# Patient Record
Sex: Female | Born: 1935 | Race: White | Hispanic: No | Marital: Single | Smoking: Never smoker
Health system: Southern US, Community
[De-identification: ages and names within clinical notes are randomized; demographics above are authoritative.]

## PROBLEM LIST (undated history)

## (undated) DIAGNOSIS — D649 Anemia, unspecified: Secondary | ICD-10-CM

## (undated) DIAGNOSIS — I2510 Atherosclerotic heart disease of native coronary artery without angina pectoris: Secondary | ICD-10-CM

## (undated) DIAGNOSIS — I10 Essential (primary) hypertension: Secondary | ICD-10-CM

## (undated) DIAGNOSIS — F29 Unspecified psychosis not due to a substance or known physiological condition: Secondary | ICD-10-CM

## (undated) DIAGNOSIS — F32A Depression, unspecified: Secondary | ICD-10-CM

## (undated) DIAGNOSIS — R627 Adult failure to thrive: Secondary | ICD-10-CM

## (undated) DIAGNOSIS — G4700 Insomnia, unspecified: Secondary | ICD-10-CM

## (undated) DIAGNOSIS — G8929 Other chronic pain: Secondary | ICD-10-CM

## (undated) DIAGNOSIS — E039 Hypothyroidism, unspecified: Secondary | ICD-10-CM

## (undated) DIAGNOSIS — M1990 Unspecified osteoarthritis, unspecified site: Secondary | ICD-10-CM

## (undated) DIAGNOSIS — N189 Chronic kidney disease, unspecified: Secondary | ICD-10-CM

## (undated) DIAGNOSIS — K219 Gastro-esophageal reflux disease without esophagitis: Secondary | ICD-10-CM

## (undated) DIAGNOSIS — F0280 Dementia in other diseases classified elsewhere without behavioral disturbance: Secondary | ICD-10-CM

## (undated) DIAGNOSIS — F0390 Unspecified dementia without behavioral disturbance: Secondary | ICD-10-CM

## (undated) DIAGNOSIS — E785 Hyperlipidemia, unspecified: Secondary | ICD-10-CM

## (undated) DIAGNOSIS — F329 Major depressive disorder, single episode, unspecified: Secondary | ICD-10-CM

## (undated) DIAGNOSIS — I4892 Unspecified atrial flutter: Secondary | ICD-10-CM

## (undated) DIAGNOSIS — G309 Alzheimer's disease, unspecified: Secondary | ICD-10-CM

## (undated) HISTORY — DX: Gastro-esophageal reflux disease without esophagitis: K21.9

## (undated) HISTORY — DX: Depression, unspecified: F32.A

## (undated) HISTORY — DX: Major depressive disorder, single episode, unspecified: F32.9

## (undated) HISTORY — DX: Unspecified psychosis not due to a substance or known physiological condition: F29

## (undated) HISTORY — DX: Unspecified dementia, unspecified severity, without behavioral disturbance, psychotic disturbance, mood disturbance, and anxiety: F03.90

## (undated) HISTORY — DX: Other chronic pain: G89.29

## (undated) HISTORY — DX: Chronic kidney disease, unspecified: N18.9

## (undated) HISTORY — DX: Hyperlipidemia, unspecified: E78.5

## (undated) HISTORY — DX: Unspecified osteoarthritis, unspecified site: M19.90

## (undated) HISTORY — DX: Insomnia, unspecified: G47.00

## (undated) HISTORY — DX: Dementia in other diseases classified elsewhere without behavioral disturbance: F02.80

## (undated) HISTORY — DX: Adult failure to thrive: R62.7

## (undated) HISTORY — DX: Atherosclerotic heart disease of native coronary artery without angina pectoris: I25.10

## (undated) HISTORY — DX: Alzheimer's disease, unspecified: G30.9

## (undated) HISTORY — DX: Unspecified atrial flutter: I48.92

## (undated) HISTORY — DX: Essential (primary) hypertension: I10

## (undated) HISTORY — DX: Anemia, unspecified: D64.9

## (undated) HISTORY — DX: Hypothyroidism, unspecified: E03.9

---

## 2002-08-13 ENCOUNTER — Encounter: Payer: Self-pay | Admitting: *Deleted

## 2002-08-13 ENCOUNTER — Emergency Department (HOSPITAL_COMMUNITY): Admission: EM | Admit: 2002-08-13 | Discharge: 2002-08-13 | Payer: Self-pay | Admitting: Emergency Medicine

## 2003-05-11 ENCOUNTER — Emergency Department (HOSPITAL_COMMUNITY): Admission: EM | Admit: 2003-05-11 | Discharge: 2003-05-12 | Payer: Self-pay | Admitting: Emergency Medicine

## 2004-03-06 ENCOUNTER — Emergency Department (HOSPITAL_COMMUNITY): Admission: EM | Admit: 2004-03-06 | Discharge: 2004-03-06 | Payer: Self-pay | Admitting: Emergency Medicine

## 2004-09-16 ENCOUNTER — Emergency Department (HOSPITAL_COMMUNITY): Admission: EM | Admit: 2004-09-16 | Discharge: 2004-09-17 | Payer: Self-pay | Admitting: Emergency Medicine

## 2004-10-09 ENCOUNTER — Encounter: Admission: RE | Admit: 2004-10-09 | Discharge: 2004-10-09 | Payer: Self-pay | Admitting: Internal Medicine

## 2004-11-12 ENCOUNTER — Emergency Department (HOSPITAL_COMMUNITY): Admission: EM | Admit: 2004-11-12 | Discharge: 2004-11-12 | Payer: Self-pay | Admitting: Emergency Medicine

## 2004-11-19 ENCOUNTER — Encounter: Admission: RE | Admit: 2004-11-19 | Discharge: 2004-11-19 | Payer: Self-pay | Admitting: Internal Medicine

## 2005-03-26 ENCOUNTER — Emergency Department (HOSPITAL_COMMUNITY): Admission: EM | Admit: 2005-03-26 | Discharge: 2005-03-26 | Payer: Self-pay | Admitting: *Deleted

## 2005-04-18 ENCOUNTER — Ambulatory Visit (HOSPITAL_COMMUNITY): Admission: RE | Admit: 2005-04-18 | Discharge: 2005-04-18 | Payer: Self-pay | Admitting: Internal Medicine

## 2005-04-29 ENCOUNTER — Inpatient Hospital Stay (HOSPITAL_COMMUNITY): Admission: EM | Admit: 2005-04-29 | Discharge: 2005-05-02 | Payer: Self-pay | Admitting: Emergency Medicine

## 2005-07-08 ENCOUNTER — Emergency Department (HOSPITAL_COMMUNITY): Admission: EM | Admit: 2005-07-08 | Discharge: 2005-07-09 | Payer: Self-pay | Admitting: Emergency Medicine

## 2005-07-10 ENCOUNTER — Emergency Department (HOSPITAL_COMMUNITY): Admission: EM | Admit: 2005-07-10 | Discharge: 2005-07-10 | Payer: Self-pay | Admitting: Emergency Medicine

## 2005-09-02 ENCOUNTER — Encounter: Admission: RE | Admit: 2005-09-02 | Discharge: 2005-09-02 | Payer: Self-pay | Admitting: Internal Medicine

## 2005-10-23 ENCOUNTER — Emergency Department (HOSPITAL_COMMUNITY): Admission: EM | Admit: 2005-10-23 | Discharge: 2005-10-23 | Payer: Self-pay | Admitting: Emergency Medicine

## 2005-11-13 ENCOUNTER — Encounter: Admission: RE | Admit: 2005-11-13 | Discharge: 2005-11-13 | Payer: Self-pay | Admitting: Internal Medicine

## 2005-12-26 ENCOUNTER — Encounter: Admission: RE | Admit: 2005-12-26 | Discharge: 2005-12-26 | Payer: Self-pay | Admitting: Internal Medicine

## 2006-03-13 ENCOUNTER — Emergency Department (HOSPITAL_COMMUNITY): Admission: EM | Admit: 2006-03-13 | Discharge: 2006-03-14 | Payer: Self-pay | Admitting: Emergency Medicine

## 2006-03-24 ENCOUNTER — Emergency Department (HOSPITAL_COMMUNITY): Admission: EM | Admit: 2006-03-24 | Discharge: 2006-03-24 | Payer: Self-pay | Admitting: Emergency Medicine

## 2006-04-01 ENCOUNTER — Emergency Department (HOSPITAL_COMMUNITY): Admission: EM | Admit: 2006-04-01 | Discharge: 2006-04-01 | Payer: Self-pay | Admitting: Emergency Medicine

## 2006-04-06 ENCOUNTER — Emergency Department (HOSPITAL_COMMUNITY): Admission: EM | Admit: 2006-04-06 | Discharge: 2006-04-06 | Payer: Self-pay | Admitting: Emergency Medicine

## 2006-05-14 ENCOUNTER — Emergency Department (HOSPITAL_COMMUNITY): Admission: EM | Admit: 2006-05-14 | Discharge: 2006-05-14 | Payer: Self-pay | Admitting: Emergency Medicine

## 2006-05-18 ENCOUNTER — Emergency Department (HOSPITAL_COMMUNITY): Admission: EM | Admit: 2006-05-18 | Discharge: 2006-05-18 | Payer: Self-pay | Admitting: Emergency Medicine

## 2007-01-23 ENCOUNTER — Emergency Department (HOSPITAL_COMMUNITY): Admission: EM | Admit: 2007-01-23 | Discharge: 2007-01-24 | Payer: Self-pay | Admitting: Emergency Medicine

## 2007-02-01 ENCOUNTER — Emergency Department (HOSPITAL_COMMUNITY): Admission: EM | Admit: 2007-02-01 | Discharge: 2007-02-02 | Payer: Self-pay | Admitting: Emergency Medicine

## 2007-02-09 ENCOUNTER — Emergency Department (HOSPITAL_COMMUNITY): Admission: EM | Admit: 2007-02-09 | Discharge: 2007-02-09 | Payer: Self-pay | Admitting: Emergency Medicine

## 2007-03-03 ENCOUNTER — Encounter: Admission: RE | Admit: 2007-03-03 | Discharge: 2007-03-03 | Payer: Self-pay | Admitting: Gastroenterology

## 2007-03-10 ENCOUNTER — Encounter: Admission: RE | Admit: 2007-03-10 | Discharge: 2007-03-10 | Payer: Self-pay | Admitting: Gastroenterology

## 2008-01-15 IMAGING — CT CT CERVICAL SPINE W/O CM
2 of 3 series · 9 of 14 positions shown, 11 images · IV contrast (agent unspecified)
Comparison: 04/28/2005.

CLINICAL DATA: Fall with confusion.  Headache and neck pain.  
 HEAD CT WITHOUT CONTRAST:
TECHNIQUE: Contiguous axial images were obtained from the base of the skull through the vertex according to standard protocol without contrast.
TECHNIQUE: Multidetector CT imaging of the cervical spine was performed.  Multiplanar CT image reconstructions were also generated.

[Series 2: head_seq 4.5 h45s st · axial · 0.43mm/px · z∈[-119,-74]mm · 2 of 32 slices shown]
[im 11/32  bone]
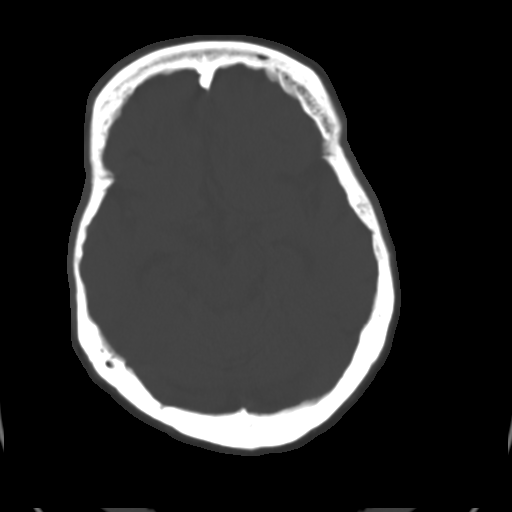
[im 21/32  bone]
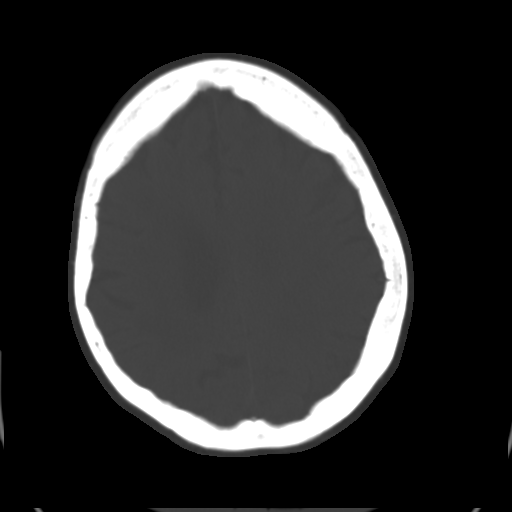

[Series 6: c_spine 2.0 b20s · axial · 0.23mm/px · z∈[-286,-166]mm · 7 of 81 slices shown, 9 images]
[im 11/81  soft-tissue]
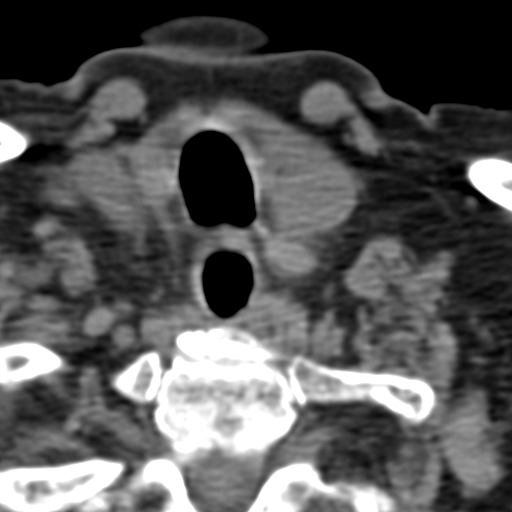
[im 11/81  bone]
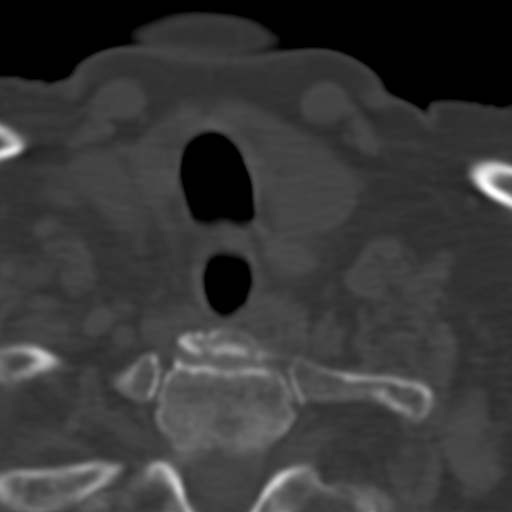
[im 21/81  bone]
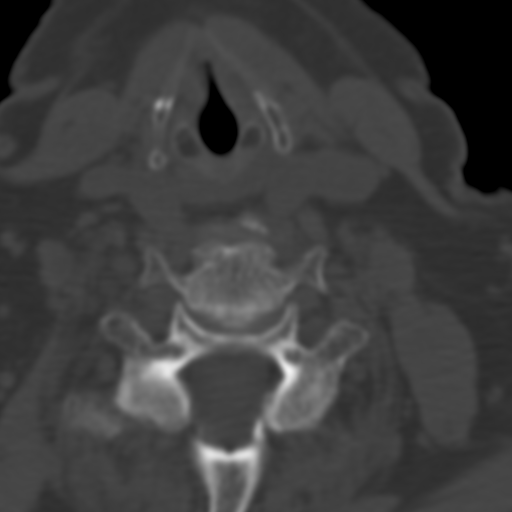
[im 31/81  bone]
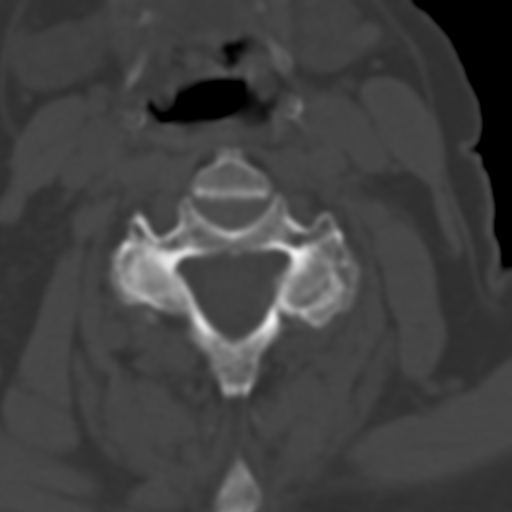
[im 41/81  bone]
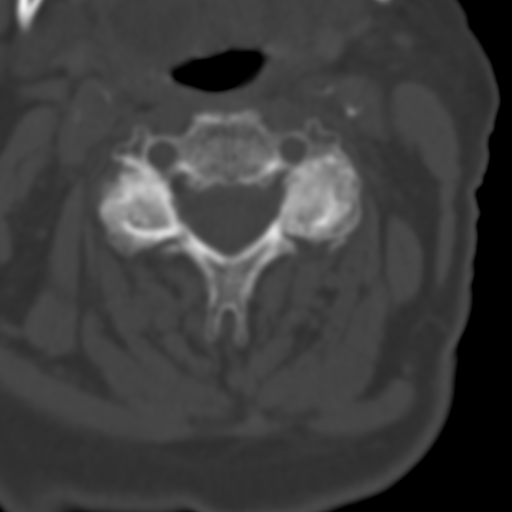
[im 51/81  soft-tissue]
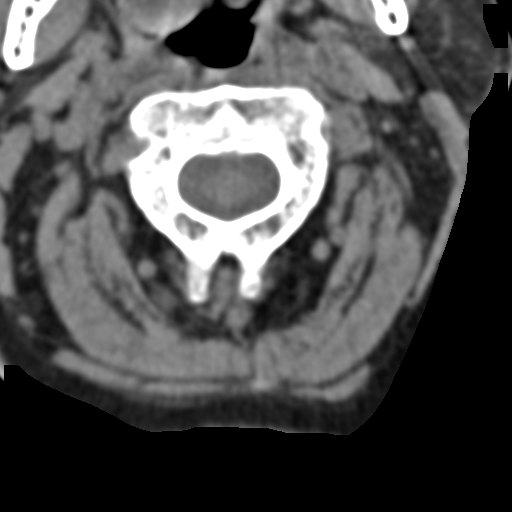
[im 51/81  bone]
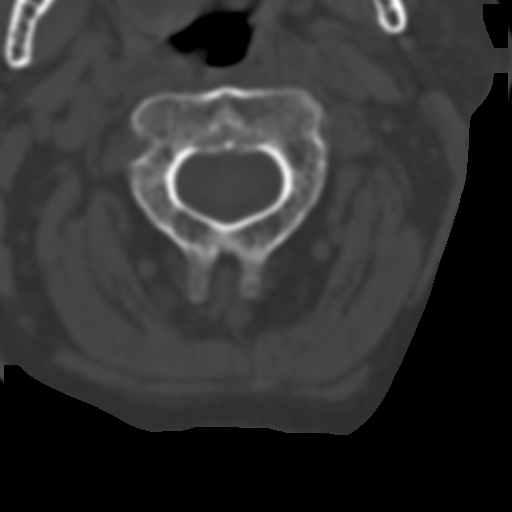
[im 61/81  bone]
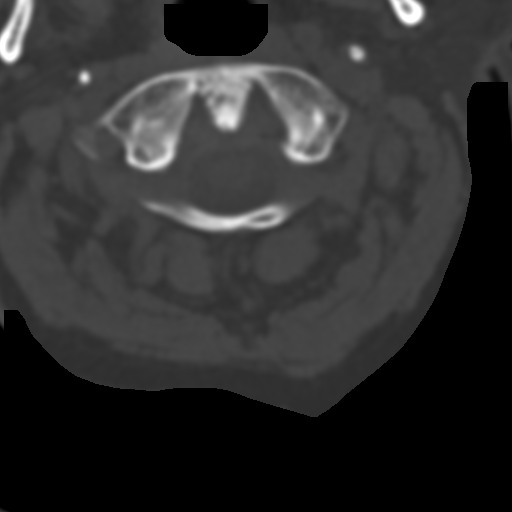
[im 71/81  bone]
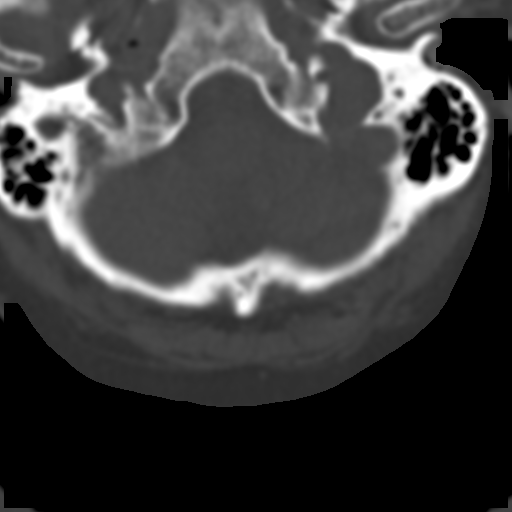

[9 of 14 positions shown; findings below may reference images not displayed]

FINDINGS: There is a stable appearance to diffuse advanced atrophy and small vessel disease.  No acute intracranial hemorrhage, mass effect or extraaxial fluid collection is identified.  No evidence of skull fracture.
IMPRESSION: Stable atrophy and small vessel disease.  No acute findings. 
 CERVICAL SPINE CT WITHOUT CONTRAST:
FINDINGS: No evidence of cervical spine fracture.  Diffuse spondylosis is present. No soft tissue abnormalities.
IMPRESSION: No acute injury to the cervical spine.

## 2008-01-15 IMAGING — CR DG SHOULDER 2+V*L*
3 series · 3 of 3 positions shown · non-contrast
Comparison: 04/28/2005.

CLINICAL DATA: Fall with injury.  
 LEFT SHOULDER ? 3 VIEW:

[view not recorded (1 of 3)]
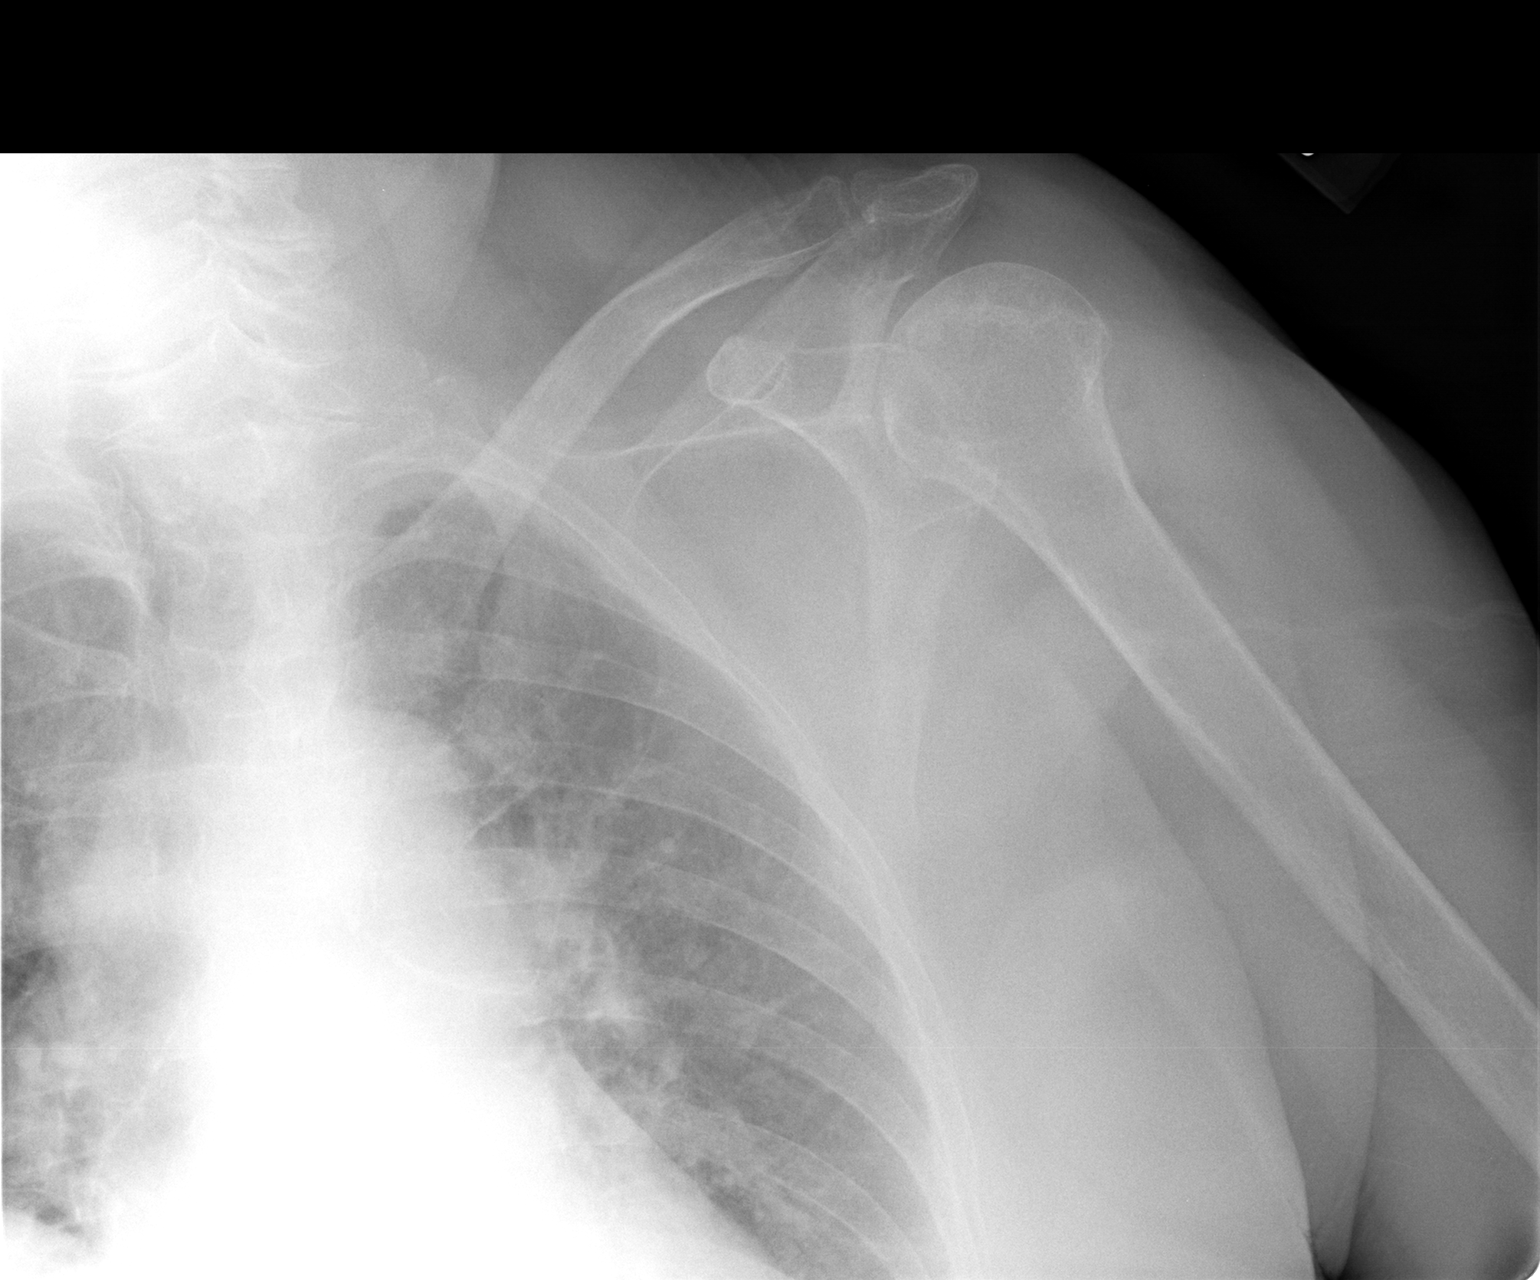

[view not recorded (2 of 3)]
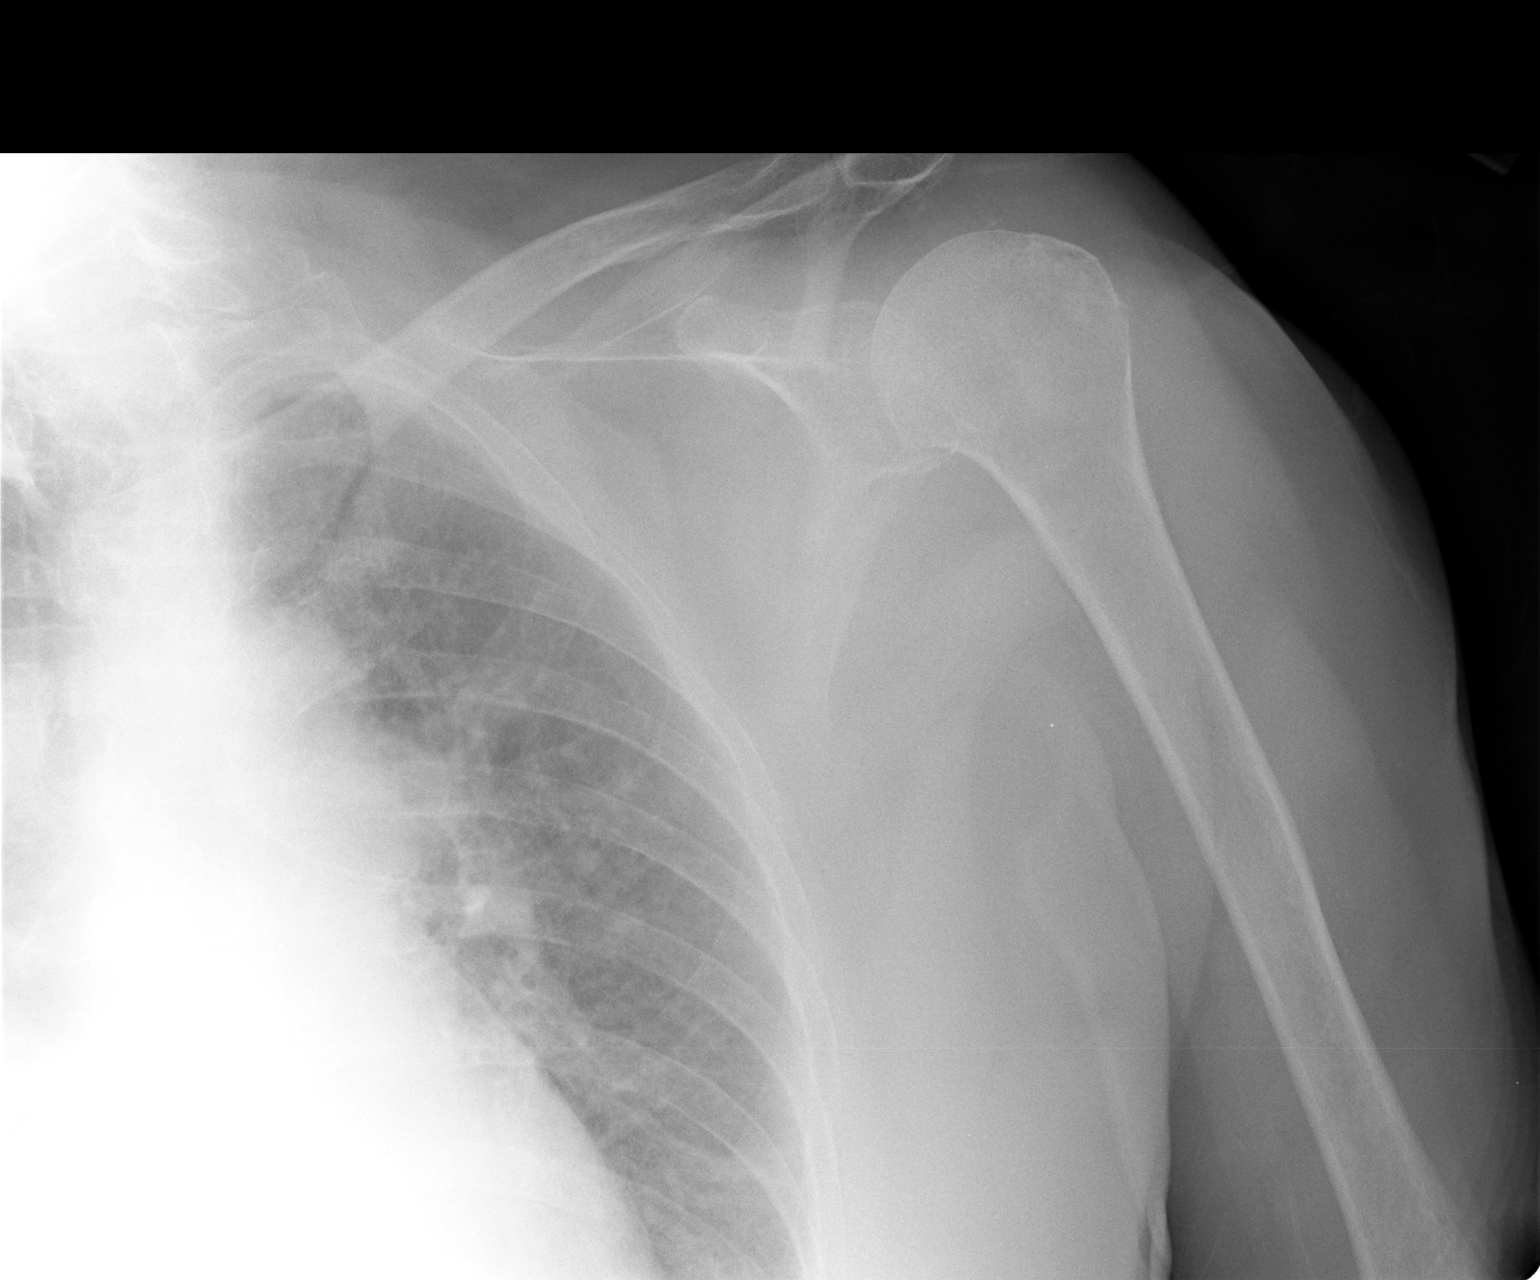

[view not recorded (3 of 3)]
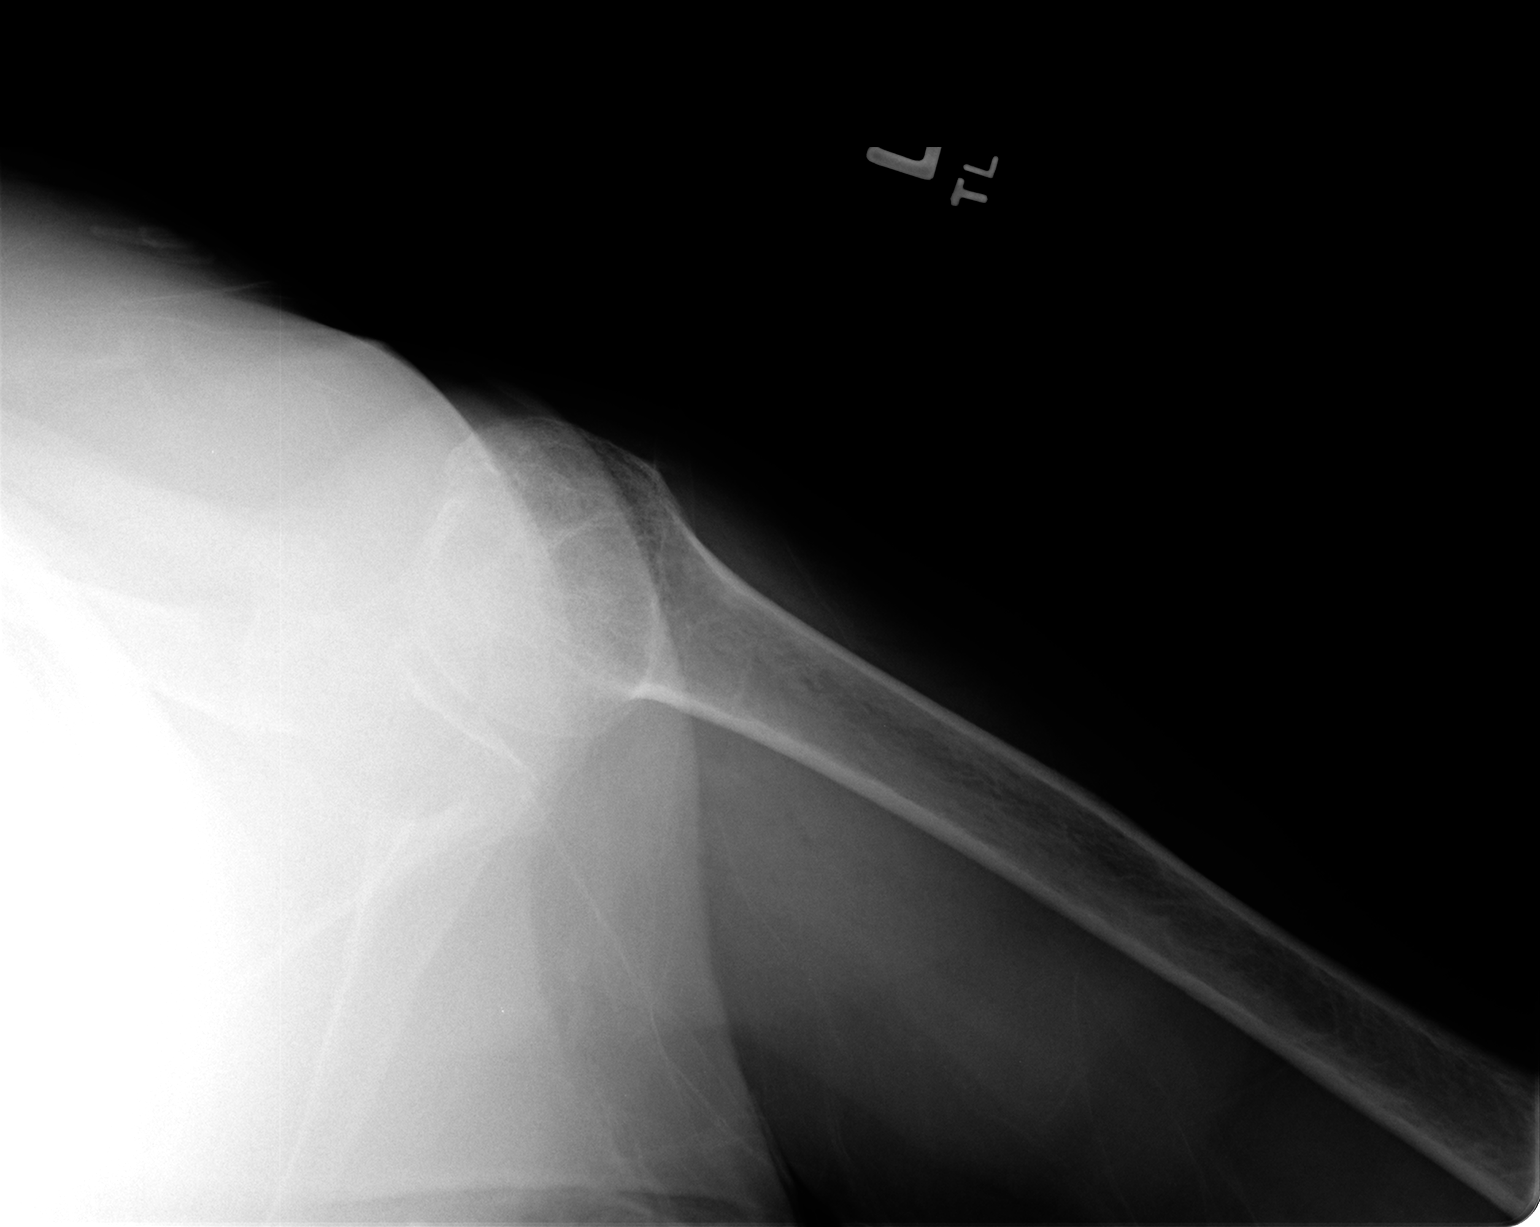

[3 of 3 positions shown; findings below may reference images not displayed]

FINDINGS: No acute fracture or dislocation.  Degenerative changes are again noted at the AC joint.
IMPRESSION: No acute findings.

## 2008-03-16 ENCOUNTER — Emergency Department (HOSPITAL_COMMUNITY): Admission: EM | Admit: 2008-03-16 | Discharge: 2008-03-16 | Payer: Self-pay | Admitting: Emergency Medicine

## 2008-09-04 ENCOUNTER — Emergency Department (HOSPITAL_COMMUNITY): Admission: EM | Admit: 2008-09-04 | Discharge: 2008-09-04 | Payer: Self-pay | Admitting: Emergency Medicine

## 2008-10-14 ENCOUNTER — Inpatient Hospital Stay (HOSPITAL_COMMUNITY): Admission: EM | Admit: 2008-10-14 | Discharge: 2008-10-18 | Payer: Self-pay | Admitting: Emergency Medicine

## 2009-01-29 ENCOUNTER — Emergency Department (HOSPITAL_COMMUNITY): Admission: EM | Admit: 2009-01-29 | Discharge: 2009-01-30 | Payer: Self-pay | Admitting: Emergency Medicine

## 2009-03-23 ENCOUNTER — Emergency Department (HOSPITAL_COMMUNITY): Admission: EM | Admit: 2009-03-23 | Discharge: 2009-03-24 | Payer: Self-pay | Admitting: Emergency Medicine

## 2010-04-14 ENCOUNTER — Encounter: Payer: Self-pay | Admitting: Internal Medicine

## 2010-05-18 ENCOUNTER — Emergency Department (HOSPITAL_COMMUNITY)
Admission: EM | Admit: 2010-05-18 | Discharge: 2010-05-18 | Disposition: A | Discharge status: Home / Self Care | Payer: Medicare Other | Attending: Emergency Medicine | Admitting: Emergency Medicine

## 2010-05-18 ENCOUNTER — Emergency Department (HOSPITAL_COMMUNITY): Payer: Medicare Other

## 2010-05-18 DIAGNOSIS — N390 Urinary tract infection, site not specified: Secondary | ICD-10-CM | POA: Insufficient documentation

## 2010-05-18 DIAGNOSIS — E039 Hypothyroidism, unspecified: Secondary | ICD-10-CM | POA: Insufficient documentation

## 2010-05-18 DIAGNOSIS — Z66 Do not resuscitate: Secondary | ICD-10-CM | POA: Insufficient documentation

## 2010-05-18 DIAGNOSIS — F0390 Unspecified dementia without behavioral disturbance: Secondary | ICD-10-CM | POA: Insufficient documentation

## 2010-05-18 DIAGNOSIS — Y9210 Unspecified residential institution as the place of occurrence of the external cause: Secondary | ICD-10-CM | POA: Insufficient documentation

## 2010-05-18 DIAGNOSIS — I6789 Other cerebrovascular disease: Secondary | ICD-10-CM | POA: Insufficient documentation

## 2010-05-18 DIAGNOSIS — I2510 Atherosclerotic heart disease of native coronary artery without angina pectoris: Secondary | ICD-10-CM | POA: Insufficient documentation

## 2010-05-18 DIAGNOSIS — I10 Essential (primary) hypertension: Secondary | ICD-10-CM | POA: Insufficient documentation

## 2010-05-18 DIAGNOSIS — S0003XA Contusion of scalp, initial encounter: Secondary | ICD-10-CM | POA: Insufficient documentation

## 2010-05-18 DIAGNOSIS — W06XXXA Fall from bed, initial encounter: Secondary | ICD-10-CM | POA: Insufficient documentation

## 2010-05-18 DIAGNOSIS — S1093XA Contusion of unspecified part of neck, initial encounter: Secondary | ICD-10-CM | POA: Insufficient documentation

## 2010-05-18 LAB — URINE MICROSCOPIC-ADD ON

## 2010-05-18 LAB — URINALYSIS, ROUTINE W REFLEX MICROSCOPIC
Nitrite: NEGATIVE
Specific Gravity, Urine: 1.026 (ref 1.005–1.030)
Urine Glucose, Fasting: NEGATIVE mg/dL
pH: 5 (ref 5.0–8.0)

## 2010-05-20 LAB — URINE CULTURE: Culture  Setup Time: 201202252105

## 2010-06-24 LAB — URINE CULTURE: Colony Count: >=100000

## 2010-06-24 LAB — COMPREHENSIVE METABOLIC PANEL
ALT: 11 U/L (ref 0–35)
Albumin: 3.1 g/dL — ABNORMAL LOW (ref 3.5–5.2)
Alkaline Phosphatase: 89 U/L (ref 39–117)
Chloride: 107 mEq/L (ref 96–112)
Potassium: 4.8 mEq/L (ref 3.5–5.1)
Sodium: 139 mEq/L (ref 135–145)
Total Bilirubin: 0.3 mg/dL (ref 0.3–1.2)
Total Protein: 6.6 g/dL (ref 6.0–8.3)

## 2010-06-24 LAB — URINALYSIS, ROUTINE W REFLEX MICROSCOPIC
Bilirubin Urine: NEGATIVE
Glucose, UA: NEGATIVE mg/dL
Ketones, ur: NEGATIVE mg/dL
Protein, ur: NEGATIVE mg/dL

## 2010-06-24 LAB — DIFFERENTIAL
Basophils Absolute: 0 10*3/uL (ref 0.0–0.1)
Basophils Relative: 1 % (ref 0–1)
Eosinophils Absolute: 0.5 10*3/uL (ref 0.0–0.7)
Eosinophils Relative: 8 % — ABNORMAL HIGH (ref 0–5)
Monocytes Absolute: 0.5 10*3/uL (ref 0.1–1.0)
Monocytes Relative: 8 % (ref 3–12)

## 2010-06-24 LAB — CBC
Hemoglobin: 12.5 g/dL (ref 12.0–15.0)
Platelets: 273 10*3/uL (ref 150–400)
RDW: 13.7 % (ref 11.5–15.5)
WBC: 6.6 10*3/uL (ref 4.0–10.5)

## 2010-06-24 LAB — URINE MICROSCOPIC-ADD ON

## 2010-06-26 LAB — URINE MICROSCOPIC-ADD ON

## 2010-06-26 LAB — CBC
HCT: 38 % (ref 36.0–46.0)
MCHC: 33.5 g/dL (ref 30.0–36.0)
Platelets: 316 10*3/uL (ref 150–400)
RDW: 13.5 % (ref 11.5–15.5)

## 2010-06-26 LAB — COMPREHENSIVE METABOLIC PANEL
Albumin: 3 g/dL — ABNORMAL LOW (ref 3.5–5.2)
Alkaline Phosphatase: 123 U/L — ABNORMAL HIGH (ref 39–117)
BUN: 32 mg/dL — ABNORMAL HIGH (ref 6–23)
Calcium: 9.3 mg/dL (ref 8.4–10.5)
Creatinine, Ser: 1.6 mg/dL — ABNORMAL HIGH (ref 0.4–1.2)
Glucose, Bld: 130 mg/dL — ABNORMAL HIGH (ref 70–99)
Potassium: 4.9 mEq/L (ref 3.5–5.1)
Total Protein: 6.9 g/dL (ref 6.0–8.3)

## 2010-06-26 LAB — DIFFERENTIAL
Lymphocytes Relative: 18 % (ref 12–46)
Lymphs Abs: 1.1 10*3/uL (ref 0.7–4.0)
Monocytes Absolute: 0.6 10*3/uL (ref 0.1–1.0)
Monocytes Relative: 9 % (ref 3–12)
Neutro Abs: 4.2 10*3/uL (ref 1.7–7.7)
Neutrophils Relative %: 67 % (ref 43–77)

## 2010-06-26 LAB — URINALYSIS, ROUTINE W REFLEX MICROSCOPIC
Glucose, UA: NEGATIVE mg/dL
Specific Gravity, Urine: 1.019 (ref 1.005–1.030)

## 2010-06-30 LAB — BASIC METABOLIC PANEL
BUN: 27 mg/dL — ABNORMAL HIGH (ref 6–23)
CO2: 24 mEq/L (ref 19–32)
CO2: 24 mEq/L (ref 19–32)
CO2: 25 mEq/L (ref 19–32)
Calcium: 7.9 mg/dL — ABNORMAL LOW (ref 8.4–10.5)
Calcium: 8.1 mg/dL — ABNORMAL LOW (ref 8.4–10.5)
Calcium: 9.1 mg/dL (ref 8.4–10.5)
Chloride: 107 mEq/L (ref 96–112)
Creatinine, Ser: 1.55 mg/dL — ABNORMAL HIGH (ref 0.4–1.2)
Creatinine, Ser: 1.66 mg/dL — ABNORMAL HIGH (ref 0.4–1.2)
GFR calc Af Amer: 34 mL/min — ABNORMAL LOW (ref >60)
GFR calc Af Amer: 35 mL/min — ABNORMAL LOW (ref >60)
GFR calc Af Amer: 38 mL/min — ABNORMAL LOW (ref >60)
GFR calc non Af Amer: 29 mL/min — ABNORMAL LOW (ref >60)
GFR calc non Af Amer: 31 mL/min — ABNORMAL LOW (ref >60)
Potassium: 4.6 mEq/L (ref 3.5–5.1)
Sodium: 138 mEq/L (ref 135–145)

## 2010-06-30 LAB — URINALYSIS, ROUTINE W REFLEX MICROSCOPIC
Bilirubin Urine: NEGATIVE
Ketones, ur: NEGATIVE mg/dL
Nitrite: POSITIVE — AB
Specific Gravity, Urine: 1.018 (ref 1.005–1.030)
Urobilinogen, UA: 0.2 mg/dL (ref 0.0–1.0)
pH: 5.5 (ref 5.0–8.0)

## 2010-06-30 LAB — CARDIAC PANEL(CRET KIN+CKTOT+MB+TROPI)
CK, MB: 1 ng/mL (ref 0.3–4.0)
Relative Index: INVALID (ref 0.0–2.5)
Total CK: 50 U/L (ref 7–177)
Troponin I: 0.03 ng/mL (ref 0.00–0.06)

## 2010-06-30 LAB — URINE MICROSCOPIC-ADD ON

## 2010-06-30 LAB — CBC
Hemoglobin: 11.1 g/dL — ABNORMAL LOW (ref 12.0–15.0)
MCHC: 33.1 g/dL (ref 30.0–36.0)
MCHC: 33.3 g/dL (ref 30.0–36.0)
MCV: 99.1 fL (ref 78.0–100.0)
Platelets: 257 10*3/uL (ref 150–400)
RBC: 3.31 MIL/uL — ABNORMAL LOW (ref 3.87–5.11)
RBC: 3.51 MIL/uL — ABNORMAL LOW (ref 3.87–5.11)
RDW: 13.3 % (ref 11.5–15.5)
RDW: 13.4 % (ref 11.5–15.5)

## 2010-06-30 LAB — URINE CULTURE: Colony Count: 1000

## 2010-06-30 LAB — DIFFERENTIAL
Basophils Absolute: 0 10*3/uL (ref 0.0–0.1)
Basophils Relative: 0 % (ref 0–1)
Eosinophils Absolute: 0.3 10*3/uL (ref 0.0–0.7)
Neutro Abs: 6.3 10*3/uL (ref 1.7–7.7)
Neutrophils Relative %: 75 % (ref 43–77)

## 2010-06-30 LAB — CK TOTAL AND CKMB (NOT AT ARMC)
CK, MB: 0.9 ng/mL (ref 0.3–4.0)
Relative Index: INVALID (ref 0.0–2.5)
Total CK: 50 U/L (ref 7–177)

## 2010-06-30 LAB — APTT: aPTT: 33 seconds (ref 24–37)

## 2010-07-01 LAB — COMPREHENSIVE METABOLIC PANEL
BUN: 28 mg/dL — ABNORMAL HIGH (ref 6–23)
CO2: 23 mEq/L (ref 19–32)
Chloride: 111 mEq/L (ref 96–112)
Creatinine, Ser: 1.83 mg/dL — ABNORMAL HIGH (ref 0.4–1.2)
GFR calc non Af Amer: 26 mL/min — ABNORMAL LOW (ref >60)
Glucose, Bld: 106 mg/dL — ABNORMAL HIGH (ref 70–99)
Total Bilirubin: 0.4 mg/dL (ref 0.3–1.2)

## 2010-07-01 LAB — URINALYSIS, ROUTINE W REFLEX MICROSCOPIC
Bilirubin Urine: NEGATIVE
Ketones, ur: NEGATIVE mg/dL
Nitrite: NEGATIVE
Protein, ur: NEGATIVE mg/dL
Urobilinogen, UA: 0.2 mg/dL (ref 0.0–1.0)

## 2010-07-01 LAB — CBC
HCT: 37.7 % (ref 36.0–46.0)
MCV: 99.5 fL (ref 78.0–100.0)
RBC: 3.79 MIL/uL — ABNORMAL LOW (ref 3.87–5.11)
WBC: 13 10*3/uL — ABNORMAL HIGH (ref 4.0–10.5)

## 2010-07-01 LAB — DIFFERENTIAL
Basophils Absolute: 0 10*3/uL (ref 0.0–0.1)
Eosinophils Relative: 1 % (ref 0–5)
Lymphocytes Relative: 6 % — ABNORMAL LOW (ref 12–46)
Neutro Abs: 11.3 10*3/uL — ABNORMAL HIGH (ref 1.7–7.7)

## 2010-08-06 NOTE — H&P (Signed)
Debbie [name], Debbie [name]               ACCOUNT NO.:  [id number]   MEDICAL RECORD NO.:  [id number]          PATIENT TYPE:  EMS   LOCATION:  ED                           FACILITY:  [facility]   PHYSICIAN:  Lucile Crater, MD         DATE OF BIRTH:  [birth year]   DATE OF ADMISSION:  10/13/2008  DATE OF DISCHARGE:                              HISTORY & PHYSICAL   PRIMARY CARE PHYSICIAN:  Dr. Lavonda Jumbo.   CHIEF COMPLAINT:  Cough.   HISTORY OF PRESENT ILLNESS:  The patient is a 77 year old female who is  sent from the assisted living facility after she has been having cough.  The cough and gagging have been going on for a month now.  But  reportedly, there was acute worsening of the cough last night and,  therefore, she was brought to the ED.  The patient has been coughing  continuosly all the time  she was in the ER.  She wants to eat  something, but there is concern for aspiration.  The patient feels like  she has a foreign body in her throat.  The patient has a history of  dementia and so she could not provide much of the history.  All she  asked for is some food to eat.  Given the cough and the choking  sensation, the ER physician had an x-ray of the soft tissues of the neck  done and there is limited evaluation and the epiglottis was not  visualized and the CT could not be performed and they recommended a CT  of the neck.  A CT of the soft tissues of the neck  was done which  revealed no foreign body, but there was chronic calcification at the  right vallecula.  Generally, supraglottic soft tissue  thickening/swelling was seen.  This is questionable for laryngitis and a  direct visualization was recommended.  There was stable tonsillar pillar  hypertrophy.  There was no lymphadenopathy.  Given the CT findings, we  have consulted with the ENT surgeon on-call and he thought that the  glottis was probably closed on the CAT scan because the patient has been  coughing and probably she was coughing  at that time.  He recommended  monitoring the patient.  He is going to come see her and do a direct  visualization.  The patient is stable.  There is no stridor.  She is  breathing comfortably with O2 saturations above 95% on room air.   REVIEW OF SYSTEMS:  Could not be done given the patient's baseline  dementia.   PAST MEDICAL HISTORY:  1. Coronary artery disease.  2. Hypertension.  3. Dementia.  4. Major depressive disorder.  5. Gastritis.  6. Hyperlipidemia.  7. Hypothyroidism.   ALLERGIES:  AMBIEN.   CURRENT MEDICATIONS:  In the assisted living;  1. Calcium antacid 500 mg 2 tablets by mouth after meals.  2. Vitamin D 50,000 unit Softgel 1 cap by mouth once weekly.  3. Enulose 1 tablespoon full by mouth everyday.  4. Synthroid 50 mcg 1 tablet p.o. daily.  5. Vitamin B12 at 1000 mcg 1 tablet p.o. daily.  6. Docusate 100 mg 1 capsule by mouth b.i.d. for constipation.  7. Pantoprazole 40 mg p.o. b.i.d.  8. Ferrous sulfate 325 mg 1 tablet at bedtime.  9. Remeron 15 mg by mouth at bedtime.  10.Primidone 50 mg 1 tablet p.o. at bedtime.  11.Acetaminophen 500 mg 2 tablets p.o. q.8 h., p.r.n. for pain or      temperature.  12.Milk of Magnesia 400 mg per 5 mL, 30 mL by mouth q.6 h., p.r.n.      constipation.  13.Promethazine 25 mg 1 tablet p.o. q.4 h., as needed for nausea.   SOCIAL HISTORY:  The patient currently lives at an assisted living  facility.  There is no history of tobacco, alcohol or illicit drugs.   FAMILY HISTORY:  Noncontributory.   PHYSICAL EXAMINATION:  VITAL SIGNS:  T-max 98, blood pressure 117/56,  pulse rate 74, respiratory rate 18, O2 saturations 95-98% on room air.  GENERAL APPEARANCE:  Not in acute distress.  The patient is coughing.  Alert, awake, responds to questions.  Follows commands, but cannot give  answers appropriately.  HEENT:  Normocephalic, atraumatic.  NECK:  No JVD, lymphadenopathy or carotid bruit.  CVS:  Regular rhythm.  No murmurs,  rubs or gallops.  LUNGS:  Clear to auscultation bilaterally.  There is no wheeze, no  stridor.  The patient has constant gagging like she has a foreign body.  ABDOMEN:  Benign.  NEURO:  The patient moves all her limbs.  Complete neurological  assessment not possible given her dementia.   LABORATORIES AND STUDIES:  WBC 8400, hemoglobin 12.6, hematocrit 38.2,  platelets 257,000.  CT scan of the soft tissues:  1. No foreign body identified.  Chronic calcification of the right      vallecula.  2. Generalized supraglottic soft tissue thickening/swelling of unknown      etiology.  Query laryngitis on direct visualization, may evaluate      further.  Stable tonsillar pillar hypertrophy.  No lymphadenopathy.      No parasinus inflammatory changes.   Chest x-ray one-view, mild increased bibasilar atelectasis, no  consolidation or edema.   ASSESSMENT/PLAN:  1. Cough.  The patient has been having cough for more than a month,      but there is acute worsening of the course.  The CT findings are      concerning.  We have consulted ENT.  The ENT surgeon is going to      come and do a direct visualization.  We will closely monitor the      patient for any respiratory distress.  Currently, the patient does      not have any airway compromise.  The patient is do not      resuscitate/do not intubate, but we will still closely monitor in      the intensive care unit.  We will give the patient corticosteroids      to help with the edema.  2. Hypothyroidism.  She is on Synthroid.  Recheck a TSH.  We will hold      off on all oral medications at this time given her dysphagia.  3. Dysphagia.  The patient's ability to swallow is highly      questionable.  We will have an official swallow evaluation done.      Until then, she will be made n.p.o.  4. Hyperlipidemia.  We will check a fasting lipid panel.  5. Hypertension,  well controlled.  6. Coronary artery disease.  We will cycle cardiac enzymes.  7.  Deep venous thrombosis prophylaxis with sequential compression      devices.  8. Fluid/Electrolyte/Nutrition:  We will give the patient normal      saline.  We will replace electrolytes as needed.  She will be made      n.p.o. until she is able to pass a swallow evaluation.   DISPOSITION:  We will admit the patient to the ICU for closer  monitoring.  Awaiting ENT surgeon visit.      Lucile Crater, MD  Electronically Signed     TA/MEDQ  D:  10/14/2008  T:  10/14/2008  Job:  [id number]

## 2010-08-06 NOTE — Consult Note (Signed)
Debbie [name], Debbie [name]               ACCOUNT NO.:  [id number]   MEDICAL RECORD NO.:  [id number]          PATIENT TYPE:  INP   LOCATION:  1228                         FACILITY:  [facility]   PHYSICIAN:  Kristine Garbe. Ezzard Standing, M.D.DATE OF BIRTH:  06-Aug-1911   DATE OF CONSULTATION:  10/13/2008  DATE OF DISCHARGE:                                 CONSULTATION   REASON FOR CONSULT:  Evaluate the patient with chronic throat clearing  and questionable airway problems.   BRIEF HISTORY:  Debbie [name] is a 77 year old white female who was  transferred from an assisted living facility secondary to a chronic  cough with acute exacerbation.  She has had a chronic cough for over a  month, but over last couple of days the throat clearing cough has become  worse.  She has a sensation of a foreign body in the throat.  She was  transferred to [facility] where a CT scan was performed  and demonstrates some diffuse supraglottic swelling.  No obvious foreign  body noted.  She was not in any respiratory distress with O2 sat of 96%.  She had normal white count.  On CT scan, it was recommended direct  visualization of the supraglottis and epiglottis.   On examination, the patient is chronically clearing her throat, but has  no real respiratory difficulty.  She is able to sip water easily with no  gross evidence of aspiration.  On examination of the neck, she has some  mild submental fullness, but this is soft and nontender.  She has no  significant swelling of the lateral neck and no adenopathy.  Oral exam  was clear.  She was noted to have a very large torus palatine.  Tonsillar areas appeared benign and not obstructing.  Nasal exam  revealed mild nasal congestion.  On fiberoptic laryngoscopy via the  right naris, the nasopharynx was clear and no evidence of infection.  Evaluation of the base of tongue was clear and soft, epiglottis was  normal in appearance without any acute swelling.  Vocal  cords were clear  bilaterally with no significant vocal cord pathology noted.  She had  normal vocal cord mobility with wide glottis on breathing.  Subglottis  area likewise was clear.  She did have some mild erythema and edema of  the AE folds as well as the arytenoid mucosa consistent with findings  seen in GE reflux disease.  There is no obvious foreign body visualized  and no obvious foreign body noted on the CT scan.   IMPRESSION:  Chronic coughing and throat clearing with foreign body  sensation.  Clinically, this is consistent with probable  gastroesophageal reflux disease.  The patient was able to drink at the  bedside without any obvious aspiration.  I think it will be okay to have  her on a p.o. diet.  We would recommend swallow evaluation, placing her  on antacid therapy, and medication possibly some antianxiety medicine to  help her relax.  If symptoms do not improve over the next few days,  could consider direct laryngoscopy if needed.  We will  follow up with  the patient later this week.           ______________________________  Kristine Garbe. Ezzard Standing, M.D.     CEN/MEDQ  D:  10/14/2008  T:  10/15/2008  Job:  [id number]

## 2010-08-06 NOTE — Discharge Summary (Signed)
NAMELESSLIE, MOSSA               ACCOUNT NO.:  [id number]   MEDICAL RECORD NO.:  [id number]          PATIENT TYPE:  INP   LOCATION:  1413                         FACILITY:  [facility]   PHYSICIAN:  Corinna L. Lendell Caprice, MDDATE OF BIRTH:  Jun 12, 1911   DATE OF ADMISSION:  10/14/2008  DATE OF DISCHARGE:  10/18/2008                               DISCHARGE SUMMARY   ADDENDUM:  This is an addendum to the previously dictated discharge  summary by Dr. Suanne Marker on October 17, 2008.  Since yesterday, the patient  has remained clinically unchanged.  Her vital signs and exam as well as  laboratories are essentially unchanged.  The nurse has kindly called Dr.  Narda Bonds who recommends no further ENT workup or followup needed at  this time.  The patient could potentially follow up with Pulmonology as  an outpatient if appropriate.  At this point, the patient is stable for  discharge back to facility.  All other instructions, etc. are per her  previous discharge summary.      Corinna L. Lendell Caprice, MD  Electronically Signed     CLS/MEDQ  D:  10/18/2008  T:  10/18/2008  Job:  [id number]   cc:   Lavonda Jumbo, M.D.  Fax: [phone]

## 2010-08-06 NOTE — Discharge Summary (Signed)
Debbie [name], Debbie [name]               ACCOUNT NO.:  [id number]   MEDICAL RECORD NO.:  [id number]          PATIENT TYPE:  INP   LOCATION:  1413                         FACILITY:  [facility]   PHYSICIAN:  Kela Millin, M.D.DATE OF BIRTH:  February 11, 1912   DATE OF ADMISSION:  10/14/2008  DATE OF DISCHARGE:                               DISCHARGE SUMMARY   DISCHARGE DIAGNOSES:  1. Throat clearing/? a cough, chronic - unclear etiology, consistent      with probable gastroesophageal reflux disease per ENT, (Dr.      Ezzard Standing).  2. Hard palate prominence - per ENT is very large torus palatine,      nonobstructing and benign-appearing.  3. Klebsiella urinary tract infection.  4. Renal insufficiency - improved.  5. Dementia - followed by Hospice.  6. Hypothyroidism, undertreated - Synthroid dose increased.  7. Hypertension.  8. History of coronary artery disease.  9. History of major depressive disorder.  10.History of gastritis.  11.History of hyperlipidemia.   PROCEDURES AND STUDIES:  1. Soft tissue neck x-ray - a limited evaluation of the soft tissues      of the mid neck.  The epiglottis is not visualized.  2. A CT scan of neck without contrast - no foreign body identified.      Chronic calcification at the right vallecula.  Generalized      supraglottic soft tissue thickening/swelling of unknown etiology.      Query laryngitis.  Stable tonsillar pillar hypertrophy.  No      paranasal sinus inflammatory changes.  3. Fiberoptic laryngoscopy via the right nares - per Dr. Dillard Cannon - nasopharynx clear with no evidence of infection.      Evaluation of base of tongue clear and soft, epiglottis normal in      appearance without any acute swelling.  Vocal cords clear      bilaterally with no significant vocal cord pathology noted.      Subglottis area likewise was clear.  Mild erythema and edema of the      AE folds as well as the arytenoid mucosa consistent with findings      seen  in GERD.  No obvious foreign body visualized.  Patient noted      on exam to have a very large torus  palatine, tonsillar areas      appeared benign and nonobstructing.   BRIEF HISTORY:  The patient is a 77 year old, white, female resident of  an assisted living facility, who presented with complaints of ?  cough/constant throat clearing for 1 month.  It also was reported that  patient had been having some gagging as well.  The assisted living  facility reported that her symptoms worsened on the night prior to  admission and she was brought to the ED for further evaluation and  management.  She reported that she felt like she had a foreign body in  her throat.  It was also noted that she has a history of dementia and  was unable to provide too much history.  In the ED, she was asking  for  food to eat.  A soft tissue x-ray of neck and a CT was done in the ED  and the results as stated above.  ENT was consulted for further  recommendations, and Dr. Ezzard Standing saw the patient.   Please see the full admission history and physical dictated on October 14, 2008, by Dr. Alfredia Client for details of the admission physical exam as well  as the laboratory data.   HOSPITAL COURSE BY PROBLEM:  1. Constant throat clearing/? A cough with a foreign-body sensation -      as discussed above, upon admission, a plain x-ray was done and that      was followed by a CT scan of her neck and the results are as stated      above.  ENT was consulted following the CT findings and Dr. Ezzard Standing      saw the patient and a fiberoptic laryngoscopy was done, and he      noted that there was no evidence of epiglottitis, also no foreign      body, and no vocal cord pathology noted.  Following his exam, he      recommended that patient be placed on a PPI as the arytenoid mucosa      findings were consistent with GERD.  He also recommended      antianxiety medications to help the patient relax, and antitussives      as well and this was  done.  Speech Therapy was consulted as well      for a swallow evaluation and this was done and it revealed a mild      to moderate pharyngeal dysphagia without aspiration of any      consistency tested.  Following this, they recommended the dysphagia-      II diet with thin liquids and the patient has been tolerating this      well.  With all these interventions, the patient's throat clearing      is persisting,?  ENT to follow for further      evaluation/recommendations.  2. Dysphagia - as discussed above, the patient had a modified barium      swallow per Speech Therapy, was found to have mild to moderate      dysphagia, but no aspiration of any consistencies.  The patient was      placed on the dysphagia-II with thin liquids as recommended and she      has been tolerating this.  3. Klebsiella urinary tract infection - the patient's urinalysis was      consistent with a UTI and she was empirically started on      antibiotics.  The urine cultures grew Klebsiella that was sensitive      to Cipro.  She has remained afebrile and hemodynamically stable,      her last white cell count on July 26th was 5.9.  She will be      discharged on oral antibiotics to complete the antibiotic course.  4. Chronic renal insufficiency - on admission, the patient's      creatinine was noted to be elevated at 1.66, it was noted that her      last creatinine on September 04, 2008, was 1.83.  It was monitored      during this hospital stay and it continued to improve, her last      creatinine at the time of this dictation 1.55.  5. Hypothyroidism - undertreated.  The patient had a TSH level  checked      on admission and it was found to be elevated at 5.85.  She was on      levothyroxine 50 mcg daily and the dose was increased during this      hospital stay to 75 mcg daily.  She is to follow up with her      outpatient physician for recheck of thyroid function tests in 4 to      6 weeks and further dose adjustment  as appropriate.  6. History of hypertension - she was not on any medications on      admission and her blood pressures were monitored and have remained      stable.  Outpatient physician to continue monitoring upon discharge      and further manage as clinically appropriate.  7. History of coronary artery disease - she was chest pain free,      cardiac enzymes were obtained and were negative.  8. Dementia - during this hospital stay, her outpatient Hospice nurse      saw her in the hospital and indicated that she was being followed      by Hospice outpatient for dementia.  They are to continue to follow      patient upon discharge.  9. History of depression - patient was maintained on her outpatient      medications during this hospital stay.   DISCHARGE MEDICATIONS:  1. Cipro 250 mg p.o. b.i.d. through October 24, 2008.  2. Changed to levothyroxine 75 mcg p.o. daily.  3. Xanax 0.25 mg once daily p.r.n. and 0.25 mg q.h.s.  4. Robitussin-DM 10 mL p.o. q.4 to 6 hours p.r.n.  5. Remeron 15 mg p.o. q.h.s.  6. Calcium 500 mg, 2 tablets to chew after meals as previously.  7. Vitamin D 50,000 units once weekly on Thursdays as previously.  8. Enulose 15 mL daily, hold for diarrhea.  9. Vitamin B12 1000 mcg daily as previously.  10.Colace 100 mg p.o. b.i.d., hold for diarrhea.  11.Protonix 40 mg p.o. b.i.d.  12.Iron sulfate 325 mg p.o. daily as previously.  13.Mysoline 50 mg p.o. q.h.s.  14.Tylenol 650 mg p.o. q.4 to 6 hours p.r.n.   FOLLOWUP CARE:  1. Nursing home physician/primary care physician in 1 to 2 weeks, call      for appointment.  2. Hospice to continue to follow.  3. Dr. Dillard Cannon as needed.  4. Discharging hospitalist to indicate any medication changes and      final disposition.   DISCHARGE DIET:  Dysphagia-II diet with thin liquids.  Aspiration  precautions, and supervision of all meals.      Kela Millin, M.D.  Electronically Signed     ACV/MEDQ   D:  10/17/2008  T:  10/17/2008  Job:  [id number]   cc:   Lavonda Jumbo, M.D.  Fax: [phone]

## 2010-08-09 NOTE — H&P (Signed)
Debbie [name], Debbie [name]               ACCOUNT NO.:  [id number]   MEDICAL RECORD NO.:  [id number]          PATIENT TYPE:  OBV   LOCATION:  0101                         FACILITY[facility]   PHYSICIAN:  Myrtie Neither, MD      DATE OF BIRTH:  10-23-1911   DATE OF ADMISSION:  04/28/2005  DATE OF DISCHARGE:                                HISTORY & PHYSICAL   CHIEF COMPLAINT:  Deformity and painful left shoulder.   PERTINENT HISTORY:  This is a 77 year old female who resides at one of the  living facilities and states that she had a fall, striking her head and  landing onto her left shoulder. The patient was found shortly by care giver  and brought to [facility] Room.  The patient is uncertain whether  she lost consciousness or not. She complains of severe left shoulder pain  and inability to move the left arm.   PAST MEDICAL HISTORY:  1.  Coronary artery disease.  2.  Dementia.  3.  Depression.  4.  Gastritis.  5.  Hypercholesterolemia.  6.  Hypertension.  7.  Seizures.   ALLERGIES:  None known.   MEDICATIONS:  1.  Mylanta  2.  Restoril,  3.  Zofran 8 mg.  4.  Phenergan 25 mg.  5.  Protonix 40 mg.  6.  Aspirin 81 mg.  7.  Synthroid 88 mcg.  8.  Aricept 10 mg.  9.  Celexa 20 mg.  10. Lipitor 40 mg.  11. Atenolol 25 mg.  12. Neurontin 300 mg.   SOCIAL HISTORY:  Negative for use of alcohol and tobacco.   FAMILY HISTORY:  Noncontributory.   REVIEW OF SYSTEMS:  The patient also states she has had GI workup for  gastric irritation an was instructed to have 6 small meals a day.   PHYSICAL EXAMINATION:  GENERAL: Alert and oriented, complaining of left  shoulder pain.  VITAL SIGNS:  Pulse 49, respirations 24, blood pressure 185/77.  Temperature  97.4.  HEAD: Normocephalic.  No breaks in the skin, nontender.  NECK:  Supple, nontender.  MUSCULOSKELETAL: Left shoulder obvious deformity, tender anteriorly and  laterally, palpable defect laterally.  Pulses intact.  Good  finger movements  in the left hand.  CHEST:  Good respiratory excursion.  No real tenderness.  EXTREMITIES:  Bilateral lower extremities nontender with passive and active  range of motion.  No apparent bruises or abrasions.   X-rays reveal fracture dislocation of the left shoulder.   IMPRESSION:  1.  Fracture dislocation of the left shoulder.  2.  History of coronary artery disease.  3.  Hypercholesterolemia.  4.  Hypertension.  5.  Dementia.  6.  History of depression.  7.  Gastritis.   PLAN:  The patient underwent closed manipulative reduction in the emergency  room with intravenously morphine and placed in an immobilizing sling.  The  patient will be kept in 23-hour observation and discharged in the morning to  continue with her shoulder brace and ice pack.      Myrtie Neither, MD  Electronically Signed  AC/MEDQ  D:  04/28/2005  T:  04/28/2005  Job:  [id number]

## 2010-08-09 NOTE — Discharge Summary (Signed)
Debbie [name], Debbie [name]               ACCOUNT NO.:  [id number]   MEDICAL RECORD NO.:  [id number]          PATIENT TYPE:  INP   LOCATION:  1511                         FACILITY:  [facility]   PHYSICIAN:  Myrtie Neither, MD      DATE OF BIRTH:  05-07-1911   DATE OF ADMISSION:  04/28/2005  DATE OF DISCHARGE:                                 DISCHARGE SUMMARY   ADDENDUM  The patient was previously discharged for a fracture dislocation of the left  shoulder but was admitted due to instability as far as ambulation and  independent lifestyle. The patient was to start on physical therapy and is  improved well enough to be discharged to a nursing home. The patient will be  seen back in the office in 2 weeks. Medications remain the same and  continued physical therapy for ambulation will be required. The patient is  being discharged in stable and satisfactory condition.      Myrtie Neither, MD  Electronically Signed     AC/MEDQ  D:  05/02/2005  T:  05/02/2005  Job:  [id number]

## 2010-08-09 NOTE — Discharge Summary (Signed)
Debbie [name], Debbie [name]               ACCOUNT NO.:  [id number]   MEDICAL RECORD NO.:  [id number]          PATIENT TYPE:  INP   LOCATION:  1511                         FACILITY:  [facility]   PHYSICIAN:  Myrtie Neither, MD      DATE OF BIRTH:  06-20-1911   DATE OF ADMISSION:  04/28/2005  DATE OF DISCHARGE:  04/29/2005                                 DISCHARGE SUMMARY   ADMITTING DIAGNOSES:  1.  Fracture/dislocation of left shoulder.  2.  Coronary artery disease.  3.  Dementia.  4.  Depression.  5.  Gastritis.  6.  Hypercholesterolemia.  7.  Hypertension.  8.  Seizures.   DISCHARGE DIAGNOSES:  1.  Fracture/dislocation of left shoulder.  2.  Coronary artery disease.  3.  Dementia.  4.  Depression.  5.  Gastritis.  6.  Hypercholesterolemia.  7.  Hypertension.  8.  Seizures.   COMPLICATIONS:  None.   INFECTIONS:  None.   OPERATION:  Closed manipulation, reduction of shoulder.   PERTINENT HISTORY:  This is a 77 year old female who lives in an assisted  independent living, fell, and sustained injury to her left shoulder.  She  was sent to the [facility] emergency room for treatment and was found to  have a fracture/dislocation of the left shoulder.  The patient underwent a  closed, manipulated reduction.  She tolerated the procedure quite well.  She  was kept for 23 hours observation.  Physical of the left shoulder,  tenderness, full and deformed left shoulder.  X-rays revealed  fracture/dislocation of the left shoulder.   HOSPITAL COURSE:  The patient had a closed manipulation and reduction, kept  for 23 hours observation for pain control.  The patient will be discharged  to continue her previous home medications.  Percocet 1 p.o. every 4 hours  for pain and will return to the office in 2 weeks.   MEDICATIONS:  1.  Atenolol 25 mg p.o. daily.  2.  Neurontin 300 mg twice daily.  3.  Tylenol Arthritis 650 mg twice daily.  4.  __________ 1 tablet daily.  5.  Synthroid 88 mcg  daily.  6.  Aricept 10 mg daily.  7.  Celexa 20 mg daily.  8.  Foltx daily.  9.  Lipitor 40 mg daily.  10. Phenergan 25 mg 4 times a day.  11. Zyprexa 2.5 mg nightly.  12. Percocet 1-2 every 4 hours p.r.n. pain, 7.5/500 mg   The patient will be seen back in the office in 2 weeks.      Myrtie Neither, MD  Electronically Signed     AC/MEDQ  D:  05/02/2005  T:  05/02/2005  Job:  [id number]

## 2010-08-09 NOTE — Consult Note (Signed)
NAMESAKINA, BRIONES               ACCOUNT NO.:  [id number]   MEDICAL RECORD NO.:  [id number]          PATIENT TYPE:  EMS   LOCATION:  ED                           FACILITY:  [facility]   PHYSICIAN:  Nadara Mustard, MD     DATE OF BIRTH:  27-Nov-1911   DATE OF CONSULTATION:  11/12/2004  DATE OF DISCHARGE:                                   CONSULTATION   REFERRING PHYSICIAN:  Payton Spark. Effie Shy, M.D.   HISTORY OF PRESENT ILLNESS:  The patient is a 77 year old woman, a nursing  home resident, who fell sustaining a left distal radius fracture.  The  patient was evaluated in the emergency room at [facility] and was felt to  be stable for reduction of the wrist.  The patient is seen in consultation.   The patient has dementia and her past medical history is not obtainable from  the patient; her records were reviewed and for a list of her medications,  allergies, previous surgeries, family history, social history and review of  systems, please see the patient's history and physical and medical records.   PHYSICAL EXAMINATION:  EXTREMITIES:  On examination, she had decreased range  of motion of her fingers; her hand was otherwise neurovascular intact.   RADIOLOGIC FINDINGS:  Radiograph shows a dorsally displaced distal radius  fracture, interarticular.   ASSESSMENT:  Interarticular left distal radius fracture with dorsal  displacement.   PLAN:  The patient underwent a hematoma block with 10 mL of 1% lidocaine  plain.  She underwent closed reduction and was placed in a sugar-tong  splint.  The patient tolerated the procedure well.  Postreduction  radiographs were obtained.  The patient had good movement of her fingers and  had good sensation after the closed reduction.  She had no pain after or  during the closed reduction.   The patient was then transferred back to the nursing home.  Plan to follow  up in the office in 2 weeks.  She was given instructions for ice, elevation  and no active  use of the left hand.      Nadara Mustard, MD  Electronically Signed     MVD/MEDQ  D:  11/12/2004  T:  11/13/2004  Job:  [id number]

## 2010-08-09 NOTE — Consult Note (Signed)
NAME:  Debbie [name], Debbie [name]                         ACCOUNT NO.:  [id number]   MEDICAL RECORD NO.:  [id number]                   PATIENT TYPE:  EMS   LOCATION:  MAJO                                 FACILITY:  [facility]   PHYSICIAN:  Abigail Miyamoto, M.D.              DATE OF BIRTH:  1911/12/23   DATE OF CONSULTATION:  08/13/2002  DATE OF DISCHARGE:                                   CONSULTATION   CHIEF COMPLAINT:  Abdominal pain.   HISTORY OF PRESENT ILLNESS:  This is a very pleasant 77 year old female at  assistive living secondary to dementia.  She reports that after eating a  meal last night that she developed crampy diffuse abdominal pain which is  mostly across her lower abdomen.  This occurred approximately five hours  after her meal and was getting worse overnight.  She presented to the  emergency department for further evaluation.  She reports some mild nausea,  but no emesis.  She reports she has been moving her bowels well, and now  since being in the emergency department having some fluid and pain  medication, she has had total resolution of her pain.  She denies any  previous history of pain.  She does report she has had some incontinence  with some mild burning on urination.  She denies any chest pain, shortness  of breath, she denies any cough.   PAST MEDICAL HISTORY:  1. Dementia.  2. Depression.  3. Chronic gastritis.  4. Coronary artery disease.  5. Hypertension.  6. Hypercholesterolemia.  7. Seizure disorder.   MEDICATIONS:  1. Celexa.  2. Lipitor.  3. Atenolol.  4. Zyprexa.  5. ASA.  6. Neurontin.  7. Synthroid.   PAST SURGICAL HISTORY:  None.   SOCIAL HISTORY:  She currently lives in assistive living.  She does not  smoke, does not drink alcohol.   REVIEW OF SYMPTOMS:  Otherwise unremarkable.   PHYSICAL EXAMINATION:  GENERAL:  An elderly female in no acute distress.  She is well-appearing.  HEENT:  Her pupils are anicteric.  Her pupils are reactive.   Ears, nose,  mouth, and throat:  Her ears appear normal, her hearing also appears normal.  Oropharynx is clear.  NECK:  There is no cervical adenopathy, there is no thyromegaly.  LUNGS:  There is decreased breath sounds in the left lower lung base.  There  are no rales, there are no wheezes.  Respiratory effort is normal.  CARDIOVASCULAR:  Regular rate and rhythm.  There is no peripheral edema.  ABDOMEN:  Soft, it is mildly protuberant.  It is not appreciably tender.  There is no guarding or rebound.  There is no evidence of hernias.  EXTREMITIES:  The patient moves all four extremities.  NEUROLOGIC:  Nonfocal.  VITAL SIGNS:  Temperature 97.6, pulse 68, respiratory rate 18, blood  pressure 131/58.   LABORATORY DATA:  The patient has a mildly elevated  white blood cell count  at 13.5, hemoglobin 12.7, hematocrit 34.5, and platelets of 351.  Electrolytes are normal.  BUN and creatinine are 30 and 1.8.  AST is 56, ALT  is 35, alkaline phosphatase is 137, bilirubin is 0.9.   The patient has a CT scan abdominal series which shows no acute findings.  Chest x-ray does show some mild left lower lobe atelectasis versus  infiltrate.  CAT scan of the abdomen and pelvis shows gallstones, and no  other acute findings, no intra-abdominal inflammatory process, no free air  or free fluid.  Her urinalysis shows rare bacteria, 3 to 6 white blood  cells, small leukocyte esterase, and nitrites negative.   ASSESSMENT AND PLAN:  This is an 77 year old female with abdominal pain of  uncertain etiology.  This may represent a simple gastroenteritis.  I doubt  she is having symptomatic cholelithiasis, although she did have mildly  alkaline phosphatase.  This may be secondary to dehydration.  She also has  findings consistent with possible urinary tract infection, as well as  possible early pneumonia.  At this point from a general surgical standpoint,  I do not feel a cholecystectomy is warranted at this time.   I am going to  allow her to return to the nursing home.  We will placed her on Cipro 500 mg  b.i.d. for the possible early pneumonia and urinary tract infection, as well  as if there is a possibility of cholecystitis being present.  I encouraged  her to drink plenty of fluids.  I will see her back in the office in  approximately a week.  She will continue all her normal medications, and  Darvocet-N 100 for pain.  If she continues to have persistent pain, we may  get a HIDA scan to rule out cholecystitis.                                                 Abigail Miyamoto, M.D.    DB/MEDQ  D:  08/13/2002  T:  08/13/2002  Job:  [id number]

## 2010-08-09 NOTE — Op Note (Signed)
NAMEKRYSTLE, [name]               ACCOUNT NO.:  [id number]   MEDICAL RECORD NO.:  [id number]          PATIENT TYPE:  OBV   LOCATION:  1511                         FACILITY:  [facility]   PHYSICIAN:  Myrtie Neither, MD      DATE OF BIRTH:  08-09-1911   DATE OF PROCEDURE:  04/28/2005  DATE OF DISCHARGE:                                 OPERATIVE REPORT   PREOPERATIVE DIAGNOSIS:  Fracture dislocation left shoulder.   POSTOPERATIVE DIAGNOSIS:  Fracture dislocation left shoulder.   ANESTHESIA:  IV anesthesia.   PROCEDURE:  Closed manipulation and reduction left shoulder.   The patient was placed in the supine position, given IV morphine. After  adequate sedation, gentle manipulated reduction with traction was applied to  the left upper extremity and the shoulder was reduced. The patient was  placed into a mobilizing sling, post reduction x-rays revealed left shoulder  reduced and good position of the fractured greater tuberosity. The patient  tolerated the procedure quite well.      Myrtie Neither, MD  Electronically Signed     AC/MEDQ  D:  04/29/2005  T:  04/29/2005  Job:  [id number]

## 2010-09-04 ENCOUNTER — Emergency Department (HOSPITAL_COMMUNITY)
Admission: EM | Admit: 2010-09-04 | Discharge: 2010-09-05 | Disposition: A | Discharge status: Home / Self Care | Attending: Emergency Medicine | Admitting: Emergency Medicine

## 2010-09-04 ENCOUNTER — Emergency Department (HOSPITAL_COMMUNITY)

## 2010-09-04 DIAGNOSIS — E7800 Pure hypercholesterolemia, unspecified: Secondary | ICD-10-CM | POA: Insufficient documentation

## 2010-09-04 DIAGNOSIS — F329 Major depressive disorder, single episode, unspecified: Secondary | ICD-10-CM | POA: Insufficient documentation

## 2010-09-04 DIAGNOSIS — I10 Essential (primary) hypertension: Secondary | ICD-10-CM | POA: Insufficient documentation

## 2010-09-04 DIAGNOSIS — Y9210 Unspecified residential institution as the place of occurrence of the external cause: Secondary | ICD-10-CM | POA: Insufficient documentation

## 2010-09-04 DIAGNOSIS — W06XXXA Fall from bed, initial encounter: Secondary | ICD-10-CM | POA: Insufficient documentation

## 2010-09-04 DIAGNOSIS — S0990XA Unspecified injury of head, initial encounter: Secondary | ICD-10-CM | POA: Insufficient documentation

## 2010-09-04 DIAGNOSIS — E039 Hypothyroidism, unspecified: Secondary | ICD-10-CM | POA: Insufficient documentation

## 2010-09-04 DIAGNOSIS — I2510 Atherosclerotic heart disease of native coronary artery without angina pectoris: Secondary | ICD-10-CM | POA: Insufficient documentation

## 2010-09-04 DIAGNOSIS — R51 Headache: Secondary | ICD-10-CM | POA: Insufficient documentation

## 2010-09-04 DIAGNOSIS — F3289 Other specified depressive episodes: Secondary | ICD-10-CM | POA: Insufficient documentation

## 2010-09-04 DIAGNOSIS — Z66 Do not resuscitate: Secondary | ICD-10-CM | POA: Insufficient documentation

## 2010-09-04 DIAGNOSIS — F068 Other specified mental disorders due to known physiological condition: Secondary | ICD-10-CM | POA: Insufficient documentation

## 2010-11-07 ENCOUNTER — Emergency Department (HOSPITAL_COMMUNITY)
Admission: EM | Admit: 2010-11-07 | Discharge: 2010-11-07 | Disposition: A | Discharge status: Home / Self Care | Payer: Medicare Other | Attending: Emergency Medicine | Admitting: Emergency Medicine

## 2010-11-07 ENCOUNTER — Emergency Department (HOSPITAL_COMMUNITY): Payer: Medicare Other

## 2010-11-07 DIAGNOSIS — W06XXXA Fall from bed, initial encounter: Secondary | ICD-10-CM | POA: Insufficient documentation

## 2010-11-07 DIAGNOSIS — Z66 Do not resuscitate: Secondary | ICD-10-CM | POA: Insufficient documentation

## 2010-11-07 DIAGNOSIS — E039 Hypothyroidism, unspecified: Secondary | ICD-10-CM | POA: Insufficient documentation

## 2010-11-07 DIAGNOSIS — M2550 Pain in unspecified joint: Secondary | ICD-10-CM | POA: Insufficient documentation

## 2010-11-07 DIAGNOSIS — M25519 Pain in unspecified shoulder: Secondary | ICD-10-CM | POA: Insufficient documentation

## 2010-11-07 DIAGNOSIS — I2510 Atherosclerotic heart disease of native coronary artery without angina pectoris: Secondary | ICD-10-CM | POA: Insufficient documentation

## 2010-11-07 DIAGNOSIS — T1490XA Injury, unspecified, initial encounter: Secondary | ICD-10-CM | POA: Insufficient documentation

## 2010-11-07 DIAGNOSIS — Y92009 Unspecified place in unspecified non-institutional (private) residence as the place of occurrence of the external cause: Secondary | ICD-10-CM | POA: Insufficient documentation

## 2010-11-14 ENCOUNTER — Emergency Department (HOSPITAL_COMMUNITY): Payer: Medicare Other

## 2010-11-14 ENCOUNTER — Inpatient Hospital Stay (HOSPITAL_COMMUNITY)
Admission: EM | Admit: 2010-11-14 | Discharge: 2010-11-25 | DRG: 206 | Disposition: A | Discharge status: Skilled Nursing Facility | Payer: Medicare Other | Attending: Internal Medicine | Admitting: Internal Medicine

## 2010-11-14 DIAGNOSIS — E039 Hypothyroidism, unspecified: Secondary | ICD-10-CM | POA: Diagnosis present

## 2010-11-14 DIAGNOSIS — T50995A Adverse effect of other drugs, medicaments and biological substances, initial encounter: Secondary | ICD-10-CM | POA: Diagnosis present

## 2010-11-14 DIAGNOSIS — D631 Anemia in chronic kidney disease: Secondary | ICD-10-CM | POA: Diagnosis present

## 2010-11-14 DIAGNOSIS — Z66 Do not resuscitate: Secondary | ICD-10-CM | POA: Diagnosis present

## 2010-11-14 DIAGNOSIS — Z9181 History of falling: Secondary | ICD-10-CM

## 2010-11-14 DIAGNOSIS — K5900 Constipation, unspecified: Secondary | ICD-10-CM | POA: Diagnosis present

## 2010-11-14 DIAGNOSIS — N183 Chronic kidney disease, stage 3 unspecified: Secondary | ICD-10-CM | POA: Diagnosis present

## 2010-11-14 DIAGNOSIS — Y9210 Unspecified residential institution as the place of occurrence of the external cause: Secondary | ICD-10-CM | POA: Diagnosis present

## 2010-11-14 DIAGNOSIS — I129 Hypertensive chronic kidney disease with stage 1 through stage 4 chronic kidney disease, or unspecified chronic kidney disease: Secondary | ICD-10-CM | POA: Diagnosis present

## 2010-11-14 DIAGNOSIS — E785 Hyperlipidemia, unspecified: Secondary | ICD-10-CM | POA: Diagnosis present

## 2010-11-14 DIAGNOSIS — K219 Gastro-esophageal reflux disease without esophagitis: Secondary | ICD-10-CM | POA: Diagnosis present

## 2010-11-14 DIAGNOSIS — N179 Acute kidney failure, unspecified: Secondary | ICD-10-CM | POA: Diagnosis present

## 2010-11-14 DIAGNOSIS — I4892 Unspecified atrial flutter: Secondary | ICD-10-CM | POA: Diagnosis present

## 2010-11-14 DIAGNOSIS — F19921 Other psychoactive substance use, unspecified with intoxication with delirium: Secondary | ICD-10-CM | POA: Diagnosis present

## 2010-11-14 DIAGNOSIS — S2239XA Fracture of one rib, unspecified side, initial encounter for closed fracture: Principal | ICD-10-CM | POA: Diagnosis present

## 2010-11-14 DIAGNOSIS — R1011 Right upper quadrant pain: Secondary | ICD-10-CM | POA: Diagnosis present

## 2010-11-14 DIAGNOSIS — R627 Adult failure to thrive: Secondary | ICD-10-CM | POA: Diagnosis present

## 2010-11-14 DIAGNOSIS — X58XXXA Exposure to other specified factors, initial encounter: Secondary | ICD-10-CM

## 2010-11-14 LAB — URINALYSIS, ROUTINE W REFLEX MICROSCOPIC
Bilirubin Urine: NEGATIVE
Nitrite: NEGATIVE
Specific Gravity, Urine: 1.022 (ref 1.005–1.030)
pH: 5 (ref 5.0–8.0)

## 2010-11-14 LAB — COMPREHENSIVE METABOLIC PANEL
AST: 20 U/L (ref 0–37)
Albumin: 3 g/dL — ABNORMAL LOW (ref 3.5–5.2)
Alkaline Phosphatase: 126 U/L — ABNORMAL HIGH (ref 39–117)
BUN: 25 mg/dL — ABNORMAL HIGH (ref 6–23)
Potassium: 4.8 mEq/L (ref 3.5–5.1)
Sodium: 138 mEq/L (ref 135–145)
Total Protein: 6.9 g/dL (ref 6.0–8.3)

## 2010-11-14 LAB — URINE MICROSCOPIC-ADD ON

## 2010-11-14 LAB — BLOOD GAS, ARTERIAL
Bicarbonate: 22 mEq/L (ref 20.0–24.0)
O2 Saturation: 99 %
Patient temperature: 98.6
TCO2: 20.2 mmol/L (ref 0–100)

## 2010-11-14 LAB — LIPASE, BLOOD: Lipase: 65 U/L — ABNORMAL HIGH (ref 11–59)

## 2010-11-14 LAB — DIFFERENTIAL
Basophils Absolute: 0 10*3/uL (ref 0.0–0.1)
Basophils Relative: 0 % (ref 0–1)
Eosinophils Absolute: 0.4 10*3/uL (ref 0.0–0.7)
Eosinophils Relative: 4 % (ref 0–5)
Monocytes Absolute: 0.7 10*3/uL (ref 0.1–1.0)

## 2010-11-14 LAB — CBC
HCT: 37.8 % (ref 36.0–46.0)
MCHC: 31.2 g/dL (ref 30.0–36.0)
Platelets: 236 10*3/uL (ref 150–400)
RDW: 13.7 % (ref 11.5–15.5)

## 2010-11-15 ENCOUNTER — Inpatient Hospital Stay (HOSPITAL_COMMUNITY): Payer: Medicare Other

## 2010-11-15 LAB — CBC
HCT: 36.6 % (ref 36.0–46.0)
Hemoglobin: 11.7 g/dL — ABNORMAL LOW (ref 12.0–15.0)
MCH: 31.5 pg (ref 26.0–34.0)
MCHC: 32 g/dL (ref 30.0–36.0)

## 2010-11-15 LAB — COMPREHENSIVE METABOLIC PANEL
Alkaline Phosphatase: 130 U/L — ABNORMAL HIGH (ref 39–117)
BUN: 26 mg/dL — ABNORMAL HIGH (ref 6–23)
Calcium: 8.6 mg/dL (ref 8.4–10.5)
GFR calc Af Amer: 32 mL/min — ABNORMAL LOW (ref >60)
Glucose, Bld: 110 mg/dL — ABNORMAL HIGH (ref 70–99)
Total Protein: 6.6 g/dL (ref 6.0–8.3)

## 2010-11-15 LAB — MRSA PCR SCREENING: MRSA by PCR: NEGATIVE

## 2010-11-16 ENCOUNTER — Inpatient Hospital Stay (HOSPITAL_COMMUNITY): Payer: Medicare Other

## 2010-11-16 LAB — COMPREHENSIVE METABOLIC PANEL
ALT: 18 U/L (ref 0–35)
CO2: 23 mEq/L (ref 19–32)
Calcium: 8.4 mg/dL (ref 8.4–10.5)
GFR calc Af Amer: 27 mL/min — ABNORMAL LOW (ref >60)
GFR calc non Af Amer: 22 mL/min — ABNORMAL LOW (ref >60)
Glucose, Bld: 96 mg/dL (ref 70–99)
Sodium: 140 mEq/L (ref 135–145)
Total Bilirubin: 0.5 mg/dL (ref 0.3–1.2)

## 2010-11-16 LAB — CBC
MCH: 30.9 pg (ref 26.0–34.0)
MCHC: 31 g/dL (ref 30.0–36.0)
Platelets: 196 10*3/uL (ref 150–400)
RBC: 3.37 MIL/uL — ABNORMAL LOW (ref 3.87–5.11)
RDW: 13.8 % (ref 11.5–15.5)

## 2010-11-16 LAB — URINE CULTURE
Culture  Setup Time: 201208240415
Special Requests: NEGATIVE

## 2010-11-16 LAB — IRON AND TIBC
Iron: 18 ug/dL — ABNORMAL LOW (ref 42–135)
TIBC: 165 ug/dL — ABNORMAL LOW (ref 250–470)

## 2010-11-16 LAB — FOLATE: Folate: 10.5 ng/mL

## 2010-11-16 LAB — VITAMIN B12: Vitamin B-12: 847 pg/mL (ref 211–911)

## 2010-11-17 ENCOUNTER — Inpatient Hospital Stay (HOSPITAL_COMMUNITY): Payer: Medicare Other

## 2010-11-17 LAB — BASIC METABOLIC PANEL
BUN: 27 mg/dL — ABNORMAL HIGH (ref 6–23)
BUN: 24 mg/dL — ABNORMAL HIGH (ref 6–23)
CO2: 21 mEq/L (ref 19–32)
Calcium: 8.4 mg/dL (ref 8.4–10.5)
Chloride: 110 mEq/L (ref 96–112)
Chloride: 113 mEq/L — ABNORMAL HIGH (ref 96–112)
Creatinine, Ser: 1.83 mg/dL — ABNORMAL HIGH (ref 0.50–1.10)
Creatinine, Ser: 1.51 mg/dL — ABNORMAL HIGH (ref 0.50–1.10)
GFR calc Af Amer: 31 mL/min — ABNORMAL LOW (ref >60)
Glucose, Bld: 102 mg/dL — ABNORMAL HIGH (ref 70–99)
Potassium: 4.5 mEq/L (ref 3.5–5.1)

## 2010-11-17 LAB — MAGNESIUM: Magnesium: 2.3 mg/dL (ref 1.5–2.5)

## 2010-11-17 LAB — CARDIAC PANEL(CRET KIN+CKTOT+MB+TROPI)
CK, MB: 4.5 ng/mL — ABNORMAL HIGH (ref 0.3–4.0)
Relative Index: 2.6 — ABNORMAL HIGH (ref 0.0–2.5)
Troponin I: <0.3 ng/mL (ref <0.30)

## 2010-11-18 ENCOUNTER — Inpatient Hospital Stay (HOSPITAL_COMMUNITY): Payer: Medicare Other

## 2010-11-18 LAB — BASIC METABOLIC PANEL
CO2: 19 mEq/L (ref 19–32)
Chloride: 111 mEq/L (ref 96–112)
Creatinine, Ser: 1.52 mg/dL — ABNORMAL HIGH (ref 0.50–1.10)
GFR calc Af Amer: 38 mL/min — ABNORMAL LOW (ref >60)
Potassium: 4.5 mEq/L (ref 3.5–5.1)
Sodium: 139 mEq/L (ref 135–145)

## 2010-11-18 LAB — T4, FREE: Free T4: 1.25 ng/dL (ref 0.80–1.80)

## 2010-11-18 LAB — CBC
Platelets: 199 10*3/uL (ref 150–400)
RBC: 3.18 MIL/uL — ABNORMAL LOW (ref 3.87–5.11)
RDW: 13.8 % (ref 11.5–15.5)
WBC: 6 10*3/uL (ref 4.0–10.5)

## 2010-11-18 LAB — TSH: TSH: 0.284 u[IU]/mL — ABNORMAL LOW (ref 0.350–4.500)

## 2010-11-19 LAB — CBC
HCT: 30.5 % — ABNORMAL LOW (ref 36.0–46.0)
Hemoglobin: 9.6 g/dL — ABNORMAL LOW (ref 12.0–15.0)
MCHC: 31.5 g/dL (ref 30.0–36.0)
RBC: 3.05 MIL/uL — ABNORMAL LOW (ref 3.87–5.11)
WBC: 5.1 10*3/uL (ref 4.0–10.5)

## 2010-11-19 LAB — BASIC METABOLIC PANEL
Chloride: 113 mEq/L — ABNORMAL HIGH (ref 96–112)
GFR calc non Af Amer: 33 mL/min — ABNORMAL LOW (ref >60)
Glucose, Bld: 89 mg/dL (ref 70–99)
Potassium: 4.6 mEq/L (ref 3.5–5.1)
Sodium: 141 mEq/L (ref 135–145)

## 2010-11-20 DIAGNOSIS — F39 Unspecified mood [affective] disorder: Secondary | ICD-10-CM

## 2010-11-20 LAB — CBC
Hemoglobin: 9.2 g/dL — ABNORMAL LOW (ref 12.0–15.0)
MCV: 98.6 fL (ref 78.0–100.0)
Platelets: 209 10*3/uL (ref 150–400)
RBC: 2.95 MIL/uL — ABNORMAL LOW (ref 3.87–5.11)
WBC: 4.6 10*3/uL (ref 4.0–10.5)

## 2010-11-20 LAB — COMPREHENSIVE METABOLIC PANEL
ALT: 10 U/L (ref 0–35)
AST: 13 U/L (ref 0–37)
CO2: 22 mEq/L (ref 19–32)
Chloride: 112 mEq/L (ref 96–112)
GFR calc non Af Amer: 40 mL/min — ABNORMAL LOW (ref >60)
Glucose, Bld: 84 mg/dL (ref 70–99)
Sodium: 141 mEq/L (ref 135–145)
Total Bilirubin: 0.2 mg/dL — ABNORMAL LOW (ref 0.3–1.2)

## 2010-11-21 LAB — CULTURE, BLOOD (ROUTINE X 2)
Culture  Setup Time: 201208240358
Culture: NO GROWTH
Culture: NO GROWTH

## 2010-11-22 LAB — BASIC METABOLIC PANEL
BUN: 16 mg/dL (ref 6–23)
Calcium: 8.7 mg/dL (ref 8.4–10.5)
Creatinine, Ser: 1.33 mg/dL — ABNORMAL HIGH (ref 0.50–1.10)
GFR calc Af Amer: 45 mL/min — ABNORMAL LOW (ref >60)
GFR calc non Af Amer: 37 mL/min — ABNORMAL LOW (ref >60)

## 2010-11-26 NOTE — Discharge Summary (Signed)
[name], [name]               ACCOUNT NO.:  [id number]  MEDICAL RECORD NO.:  [id number]  LOCATION:  1415                         FACILITY:  [facility]  PHYSICIAN:  Hillery Aldo, M.D.   DATE OF BIRTH:  [birth year]  DATE OF ADMISSION:  11/14/2010 DATE OF DISCHARGE:  11/23/2010                        DISCHARGE SUMMARY - REFERRING   PRIMARY CARE PHYSICIAN:  Joselyn Arrow, MD  DISCHARGE DIAGNOSES: 1. Right seventh rib fracture secondary to trauma from the Heimlich     maneuver performed as an outpatient. 2. Acute delirium secondary to pain medications. 3. Dysphagia. 4. Hypertension. 5. Acute renal failure. 6. Stage III chronic kidney disease. 7. Paroxysmal atrial flutter. 8. Constipation. 9. Hyperlipidemia. 10.History of coronary artery disease. 11.Gastroesophageal reflux disease. 12.History of pernicious anemia. 13.Hypothyroidism.  DISCHARGE MEDICATIONS: 1. Aspirin 81 mg p.o. daily. 2. Klonopin 0.5 mg, one-half tablet p.o. q.a.m. and h.s. 3. Oxycodone 5 mg tablets, 10 mg p.o. q.4 hours p.r.n. pain. 4. Haldol 0.25 mg p.o. q.h.s. 5. Lactulose 15 mL p.o. b.i.d. 6. Lorazepam 0.5 mg p.o. q.8 hours p.r.n. anxiety. 7. Protonix 40 mg p.o. daily. 8. Colace 100 mg p.o. b.i.d. 9. Lidocaine patch 5% transdermally q.12 hours. 10.Melatonin 1 mg p.o. q.h.s. 11.Mysoline 50 mg p.o. q.h.s. 12.Ondansetron 4 mg p.o. q.4 hours p.r.n. nausea or vomiting. 13.Remeron 15 mg p.o. q.h.s. 14.Synthroid 100 mcg p.o. daily. 15.Trixaicin 0.025% one application topically t.i.d. 16.Tylenol 650 mg p.o. q.6 hours p.r.n. pain. 17.Vitamin B12 250 mcg p.o. daily. 18.Vitamin D3 1000 units p.o. daily. 19.Zocor 10 mg p.o. q.h.s.  CONSULTATIONS: 1. Dr. Rogers Blocker of Psychiatry. 2. Dr. Mauro Kaufmann of Palliative Care. 3. Speech Therapy. 4. Physical Therapy. 5. Occupational Therapy.  BRIEF ADMISSION HPI:  The patient is a 77 year old female who was brought to the hospital with a chief complaint of right upper  quadrant abdominal pain that began after she suffered a choking event and was given the Heimlich maneuver.  The patient's pain could not be adequately controlled and she therefore was brought to the hospital for further evaluation and treatment.  For full details, please see the dictated report done by Dr. Sharl Ma.  PROCEDURES AND DIAGNOSTIC STUDIES: 1. Chest x-ray on November 14, 2010 showed chronic elevation of the     right hemidiaphragm.  Increased volume loss in the right lung. 2. CT scan of the abdomen and pelvis on November 14, 2010 showed a     probable small bowel ileus with otherwise no acute findings. 3. Abdominal ultrasound on November 15, 2010 showed normal findings.     The gallbladder was not discretely visualized due to overlying     bowel gas.  Small echogenic bilateral kidneys compatible with     medical renal disease with no hydronephrosis. 4. Bilateral rib films on November 16, 2010 showed chronic elevation of     the right hemidiaphragm.  Bibasilar atelectasis.  Right seventh rib     fracture with no pneumothorax. 5. Abdominal ultrasound repeated on November 17, 2010 again showed     limited visualization of the gallbladder and echogenic renal     parenchyma. 6. Chest x-ray on November 18, 2010 showed right seventh rib fracture     without gross pneumothorax.  Basilar consolidation greater on the     left, likely representative of atelectasis.  Cardiomegaly and     pulmonary vascular congestion.  DISCHARGE LABORATORY VALUES:  Magnesium was 2.1.  Sodium was 139, potassium 4.6, chloride 111, bicarb 21, BUN 16, creatinine 1.33, glucose 88, calcium 8.7.  Blood cultures were negative.  Liver function studies were within normal limits.  Free T4 was 1.25 with a TSH of 0.284. Anemia panel showed an iron value of 18, total iron binding capacity 165, 11% saturation, urine iron-binding capacity 147, vitamin B12 847, serum folate 10.5, ferritin 222.  HOSPITAL COURSE BY PROBLEM: 1. Right  seventh rib fracture with uncontrolled pain:  The patient was     admitted and a diagnostic evaluation was performed.  After an     extensive workup for the source of her right upper quadrant pain,     it was felt that her pain was from the right seventh rib fracture     secondary to trauma from the Heimlich maneuver.  The patient was     placed on a pain control regimen and at this time, is doing well     with pain management. 2. Acute delirium:  The patient's hospital course was complicated by     acute delirium, thought to be secondary to IV pain medications.     She has been put on a p.o. regimen that seems to be controlling her     pain well without excessive delirium.  She was seen and evaluated     by Psychiatry who recommended Klonopin b.i.d. and decreasing the     Haldol, which has been done.  At this point, she appears to be back     to her baseline. 3. Dysphagia:  The patient does have chronic dysphagia and was seen     and evaluated by a speech therapist.  Her dysphagia was felt to be     multifactorial and recommendations were to continue with strict     aspiration precautions with full assistance with meals.  No ice or     cold water.  Medicines should be given in applesauce followed by     water.  She should have soft, pureed meats and thin liquids.     Palliative care team did meet with the patient's family and if her     dysphagia progresses, they do not want artificial nutrition with a     PEG, Panda, or other means. 4. Hypertension:  The patient's blood pressure is currently well-     controlled. 5. Acute renal failure/stage III chronic kidney disease:  The     patient's admission creatinine was elevated over her baseline and     she was found to be in acute renal failure with creatinine of 1.89.     With IV fluid hydration, her creatinine has come down.  She is     likely at her baseline at this point. 6. Paroxysmal atrial flutter:  The patient was monitored on  telemetry     and was noted to have episodes of paroxysmal atrial flutter.  She     is not felt to be a Coumadin candidate due to fall risk.  She was     placed on aspirin for secondary risk reduction and at this time,     does not need rate control. 7. Hypothyroidism:  The patient's thyroid function studies were     checked and she had a low TSH, but her free  T4 was within normal     limits and therefore, we maintained her on her current Synthroid     dose. 8. Hyperlipidemia:  The patient was maintained on statin therapy. 9. History of coronary artery disease:  The patient was started on     aspirin for heart protection.  Her troponins were negative on     November 17, 2010. 10.Acute on chronic constipation:  The patient was given Fleet Enema     on November 22, 2010 with good results.  Her lactulose dose was     increased and she should be monitored closely for signs of     impaction. 11.Gastroesophageal reflux disease:  The patient was maintained on PPI     therapy. 12.History of pernicious anemia:  Anemia panel shows good serum folate     and B12 levels.  Her residual anemia is likely due to chronic     illness.  DISPOSITION:  The patient is medically stable for transfer back to a skilled nursing facility.  Family has met with palliative care team and has confirmed DNR/DNI status with no heroic measures.  Medical care should continue to be optimized with treatment of acute illnesses as needed.  Again, no artificial nutrition is desired and medication should be optimized for pain/anxiety control.  CONDITION ON DISCHARGE:  Improved.  DISCHARGE INSTRUCTIONS: 1. Activity:  Up with assistance only. 2. Diet:  Low-sodium, heart-healthy, soft with pureed meats and thin     liquids.  Full supervision with meals and strict aspiration/reflux     precautions. 3. Follow-up:  With Dr. Joselyn Arrow in 1-2 weeks.  Call for an     appointment.  Time spent on discharge including face-to-face time  equals approximately 40 minutes.     Hillery Aldo, M.D.     CR/MEDQ  D:  11/23/2010  T:  11/23/2010  Job:  [id number]  cc:   Lavonda Jumbo, M.D. Fax: [phone]  Electronically Signed by Hillery Aldo M.D. on 11/26/2010 05:26:03 PM

## 2010-11-26 NOTE — Progress Notes (Signed)
Debbie [name], Debbie [name]               ACCOUNT NO.:  [id number]  MEDICAL RECORD NO.:  [id number]  LOCATION:  1415                         FACILITY:  [facility]  PHYSICIAN:  Kathlen Mody, MD       DATE OF BIRTH:  [birth year]                                PROGRESS NOTE   CURRENT DIAGNOSES: 1. Right seventh rib fracture. 2. Early consolidation in the right lower lobe versus right lower lobe     atelectasis. 3. Dysphagia, on pureed diet. 4. Hypothyroidism. 5. Hypertension. 6. Agitation. 7. Chronic kidney disease. 8. Hyperlipidemia. 9. Coronary artery disease. 10.Chronic constipation. 11.Gastroesophageal reflux disease. 12.Pernicious anemia.  PERTINENT LABS: Patient had a CBC done which showed a hemoglobin of 11.8, hematocrit of 37.8.  Comprehensive metabolic panel significant for a BUN of 25 and creatinine 1.8.  Lipase slightly elevated at 65.  Urinalysis shows small leukocytes with negative culture.  Anemia panel within normal limits. TSH was slightly low with normal T4 levels.  Labs of today showed a hemoglobin of 9.6, hematocrit of 30.5.  Basic metabolic panel showed an improvement of creatinine to 1.46 with chloride of 113, mag is 2.3.  DIAGNOSTIC STUDIES: On admission, patient had a one-view chest x-ray, showed chronic elevation of the right hemidiaphragm with volume loss in the right lung. CT abdomen and pelvis without contrast showed probable small bowel ileus.  Ultrasound of the abdomen on November 15, 2010, showed gallbladder not discretely visualized due to overlying bowel gas.  Small bilateral kidneys compatible with medical renal disease.  No hydronephrosis.  Rib series done on November 16, 2010, showed chronic elevation of the right hemidiaphragm, bibasilar atelectasis with right seventh rib fracture.  No pneumothorax.  Repeat ultrasound showed limited visualization of the gallbladder as before.  Repeat chest x-ray on November 18, 2010, showed right seventh rib  fracture without gross pneumothorax, basilar consolidation greater on the right, may represent atelectasis or infiltrate, cardiomegaly with pulmonary vascular congestion.  CONSULTATIONS: Psychiatric consult was called on November 19, 2010, for agitation.  PHYSICAL EXAMINATION: VITAL SIGNS:  Temperature of 97.5, pulse of 88, respirations 16, blood pressure 132/78, saturating 95% on 2 liters of oxygen. GENERAL:  She is alert, afebrile, comfortable, in no acute distress. CARDIOVASCULAR:  S1, S2 heard. RESPIRATORY EXAM:  Decreased breath sounds in the right lower base.  No wheezes.  Scattered rhonchi. ABDOMEN:  Soft, nontender.  Bowel sounds are heard. EXTREMITIES:  No pedal edema.  BRIEF ADMISSION HISTORY: This is a 77 year old lady with history of hypothyroidism, hypertension, chronic kidney disease, coronary artery disease, was brought in for assisted living facility for right upper quadrant pain.  A few days prior to coming to [facility] ER, patient had a choking episode where Heimlich maneuver was done, and since then, she was complaining of right upper quadrant pain.  Patient was evaluated for the right upper quadrant pain in the ER with a CT abdomen and pelvis without contrast which did not show any significant abnormality except for mild ileus.  She was ordered for an ultrasound of the abdomen and was admitted to the floor.  BRIEF HOSPITAL COURSE: 1. On arrival to the floor, patient had an ultrasound of the abdomen  where the gallbladder was not properly visualized.  Patient     continued to have pain in the right upper quadrant.  She had x-ray     rib series done which showed seventh rib fracture without any     pneumothorax.  Her pain medication was increased on August 25 and     her diet was advanced.  Repeat ultrasound of the abdomen also did     not show any gallbladder abnormality.  Hence, right upper quadrant     pain was secondary to her rib fracture.  Over the  course of her     hospitalization with increased pain medication, patient developed     hypotension and tachycardia on November 17, 2010, at which point     rapid response was called and she was transferred to telemetry for     further management.  She had an EKG done which showed sinus     tachycardia.  She was given IV fluids and her hypotension resolved     and her tachycardia also resolved.  She had an echocardiogram done     which showed left ventricular ejection fraction between 60% to 65%     without any regional wall abnormalities.  Patient continued to have     episodes of agitation.  She is currently treated with pain     medications with oxycodone for the rib fracture, incentive     spirometer, and nebulizers for wheezing p.r.n., and oxygen as     needed. 2. For anxiety, patient is on p.r.n. Ativan and Psychiatric consult     was called on November 19, 2010. 3. Dysphagia.  Patient has a history of dysphagia that explains a     choking episode prior to arrival to [facility] ED.  She had speech     and swallow evaluation done here, recommended pureed diet with soft     and full assistance with meals and medications with applesauce. 4. Hypertension, controlled. 5. Stage II chronic kidney disease, improving. 6. Hyperlipidemia.  Continue with Zocor. 7. Abnormal TSH.  TSH was low.  She had a free T4 which was within     normal limits.  Her Synthroid was continued. 8. Patient is DNR  DISPOSITION: Patient will be discharged to SNF after the psychiatric consult and after her agitation is improved.  DISCHARGE MEDICATIONS: As per the discharging physician.          ______________________________ Kathlen Mody, MD     VA/MEDQ  D:  11/19/2010  T:  11/20/2010  Job:  [id number]  Electronically Signed by Kathlen Mody MD on 11/25/2010 11:59:53 PM

## 2010-12-27 LAB — PROTIME-INR: Prothrombin Time: 12.1 seconds (ref 11.6–15.2)

## 2010-12-27 LAB — POCT I-STAT, CHEM 8
BUN: 33 mg/dL — ABNORMAL HIGH (ref 6–23)
Chloride: 107 mEq/L (ref 96–112)
Glucose, Bld: 133 mg/dL — ABNORMAL HIGH (ref 70–99)
HCT: 36 % (ref 36.0–46.0)
Potassium: 4.7 mEq/L (ref 3.5–5.1)

## 2010-12-27 LAB — DIFFERENTIAL
Eosinophils Absolute: 0.3 10*3/uL (ref 0.0–0.7)
Lymphs Abs: 0.7 10*3/uL (ref 0.7–4.0)
Monocytes Absolute: 0.5 10*3/uL (ref 0.1–1.0)
Monocytes Relative: 8 % (ref 3–12)
Neutrophils Relative %: 75 % (ref 43–77)

## 2010-12-27 LAB — COMPREHENSIVE METABOLIC PANEL
ALT: 25 U/L (ref 0–35)
AST: 32 U/L (ref 0–37)
Albumin: 3.3 g/dL — ABNORMAL LOW (ref 3.5–5.2)
Calcium: 8.2 mg/dL — ABNORMAL LOW (ref 8.4–10.5)
GFR calc Af Amer: 36 mL/min — ABNORMAL LOW (ref >60)
Glucose, Bld: 138 mg/dL — ABNORMAL HIGH (ref 70–99)
Potassium: 4.5 mEq/L (ref 3.5–5.1)
Sodium: 135 mEq/L (ref 135–145)
Total Protein: 6.1 g/dL (ref 6.0–8.3)

## 2010-12-27 LAB — URINE CULTURE

## 2010-12-27 LAB — URINALYSIS, ROUTINE W REFLEX MICROSCOPIC
Bilirubin Urine: NEGATIVE
Nitrite: POSITIVE — AB
Specific Gravity, Urine: 1.021 (ref 1.005–1.030)
pH: 7 (ref 5.0–8.0)

## 2010-12-27 LAB — URINE MICROSCOPIC-ADD ON

## 2010-12-27 LAB — CBC
MCHC: 33.1 g/dL (ref 30.0–36.0)
Platelets: 248 10*3/uL (ref 150–400)
RDW: 14 % (ref 11.5–15.5)

## 2010-12-27 LAB — LACTIC ACID, PLASMA: Lactic Acid, Venous: 1.9 mmol/L (ref 0.5–2.2)

## 2010-12-30 NOTE — H&P (Signed)
NAMEJAKYIA, Debbie [name]               ACCOUNT NO.:  [id number]  MEDICAL RECORD NO.:  [id number]  LOCATION:  1345                         FACILITY:  [facility]  PHYSICIAN:  Mauro Kaufmann, MD         DATE OF BIRTH:  [birth year]  DATE OF ADMISSION:  11/14/2010 DATE OF DISCHARGE:                             HISTORY & PHYSICAL   CHIEF COMPLAINT:  Right upper quadrant pain.  HISTORY OF PRESENT ILLNESS:  This is a 77 year old female who came to the hospital with chief complaint of abdominal pain.  The patient says that right upper quadrant pain started few days ago since she had the Heimlich maneuver done a few days ago because she was choking. Abdominal pain is little bit in the right upper quadrant and it is worse on movement.  The chest x-ray shows chronic elevation of the right hemidiaphragm and appears to be increased volume loss in the right lung. The patient had a CT of the abdomen and pelvis done in the hospital, which shows mild prominence of gas filled small bowel loops with a history of ileus, but the patient does not have any history of constipation and she did have her bowel movement. Denies any nausea or vomiting.  Denies any fever.  PAST MEDICAL HISTORY:  Significant for: 1. CAD. 2. Gastritis. 3. GERD. 4. Hyperlipidemia. 5. Hypothyroidism. 6. Pernicious anemia. 7. Vitamin D deficiency.  ALLERGIES:  The patient is allergic to [facility].  MEDICATIONS:  Currently patient takes at home includes: 1. Haldol 0.5 p.o. b.i.d. 2. Vitamin B12 250 mcg p.o. 1 tablet daily. 3. Synthroid 100 mcg p.o. daily. 4. Ondansetron 4 mg 1 tablet every 4 hours as needed. 5. Tylenol 325 mg p.o. every 6 hours as needed. 6. Lorazepam 0.5 mg 1 tablet every 12 hours as needed. 7. Zocor 10 mg p.o. daily. 8. Mysoline 50 mg 1 tablet p.o. daily at bedtime. 9. Remeron 50 mg 1 tablet p.o. daily at bedtime. 10.Melatonin 1 mg p.o. tablet daily at bedtime. 11.Avelox 400 mg 1 tablet p.o. daily. 12.Trixaicin 0.025  mg 0.025% topical 3 times a day. 13.Protonix 40 mg p.o. twice a day. 14.Colace 100 mg p.o. twice a dat. 15.Vitamin D3-1000 units p.o. every morning. 16.Lactulose 15 mL every morning. 17.Lidocaine 5% transdermal 1 patch every 12 hours.  REVIEW OF SYSTEMS:  HEENT:  There is no headache, no blurred vision. NECK:  Positive history of hypothyroidism.  CHEST:  No history of COPD or emphysema.  HEART:  No history of CAD.  GASTROINTESTINAL:  As in the HPI.  GENITOURINARY:  No dysuria, urgency, frequency of urination. NEUROLOGIC:  No history of stroke or TIA in the past.  SOCIAL HISTORY:  The patient denies any alcohol abuse.  No history of illicit drug abuse.  No history of tobacco abuse.  PHYSICAL EXAMINATION:  VITAL SIGNS:  Blood pressure is 132/68, respirations 20, pulse 76, temperature 97.8, O2 sat 97% on room air. HEENT:  Head is atraumatic and normocephalic.  Eyes, extraocular muscles are intact.  Oral mucosa is pink and moist. NECK:  Supple. CHEST:  Clear to auscultation bilaterally. HEART:  S1 and S2.  Regular rate and rhythm. ABDOMEN:  There is  positive tenderness in the right upper quadrant, worse on movement.  No rigidity or guarding except in the right upper quadrant. EXTREMITIES:  No cyanosis, no clubbing, no edema. NEUROLOGICAL:  Cranial II-XII grossly intact.  Motor strength is 5/5 in both upper and lower extremities.  Sensations are intact at this time.  LABORATORY DATA:  WBC 9.7, hemoglobin 11.8, hematocrit 837.8, platelet count of 236.  Sodium is 132, potassium is 4.8, chloride is 107, CO2 of 24, BUN 25, creatinine 1.8, and glucose is 132.  Alk phos is 126. Albumin is 3.0, lipase is 65.  Urinalysis shows 7-10 RBCs per high-power field, few bacteria.  ASSESSMENT: 1. Right upper quadrant pain. 2. Ileus as per the CT of the abdomen. 3. Hypothyroidism. 4. History of pernicious anemia. 5. Gastritis.  PLAN: 1. Right upper quadrant pain.  The patient has more muscular  component     to this pain than the intra-abdominal pathology, anyway the CT of     abdomen and pelvis is negative.  I am going to still obtain an     ultrasound of the abdomen in the morning.  In the meantime, we will     treat the pain with Dilaudid and also start the patient on     Flexeril.  The chest x-ray shows mild volume loss on the right side     of the chest.  All this could be secondary to the Heimlich maneuver     the patient had after she had choking episodes few days ago.     Anyway, we will follow the patient's labs.  The patient does have a     mild elevation of lipase as well as alk phos, and we will follow     the patient's LFTs in the morning. 2. Mild ileus.  The CT of the abdomen shows mild ileus.  The patient     is already on lactulose and she did have her bowel movement.  So,     at that this time, the patient does not appear to have significant     ileus.  We will continue the patient on lactulose, and anyway, the     patient will be kept n.p.o. until the ultrasound of the abdomen is     done in the morning. 3. Hypothyroidism.  We will continue the patient on the Synthroid. 4. Pernicious anemia.  The patient will continue on vitamin B12-250     mcg p.o. daily. 5. DVT prophylaxis with Lovenox.  CODE STATUS:  The patient is currently DNR.     Mauro Kaufmann, MD     GL/MEDQ  D:  11/14/2010  T:  11/15/2010  Job:  [id number]  Electronically Signed by Mauro Kaufmann  on 12/30/2010 04:24:44 PM

## 2010-12-30 NOTE — Consult Note (Signed)
Debbie [name], Debbie [name] NO.:  [id number]  MEDICAL RECORD NO.:  [id number]  LOCATION:  1415                         FACILITY:  [facility]  PHYSICIAN:  Mauro Kaufmann, MD         DATE OF BIRTH:  07-27-1911  DATE OF CONSULTATION:  11/22/2010 DATE OF DISCHARGE:                                CONSULTATION   REQUESTING PHYSICIAN:  Hillery Aldo, MD  PRIMARY CARE PHYSICIAN:  Florentina Jenny, MD  REASON FOR CONSULTATION:  Medical treatment options.  This is the NP, Dorian Pod, received report from the team, reviewed medical records, examined the patient, then met with the patient's sister-in-law to discuss goals, options, and end-of-life issues.  Note, sister-in-law is the closest living family member of Debbie [name].  A detailed discussion was had regarding advanced directives with concept specific to the difference between an aggressive medical interventions path versus a palliative comfort care path for this patient at this time in her current situation.  Time in 10:45 and time out 12 noon with a 75- minute consult.  Greater than 50% of this time spent in medical counseling, coordination of care, and discussion of the pathophysiology of the patient's disease process.  PROBLEM LIST: 1. Failure to thrive, slow chronic decline. 2. Acute delirium, stable at this time. 3. Palliative performance score of 30% at best. 4. Dysphagia with choking episodes. 5. Anxiety and history of depression. 6. Acute pain secondary to rib fractures. 7. Albumin of 2.2. 8. Inadequate p.o. intake. 9. Questionable constipation, questionable ileus on initial CT.  There     is no bowel movement documented on this patient since admission and     she is complaining of nausea today.  She has hyperactive bowel     sounds in the upper quadrant. 10.Recurrent falls, currently residing in an assisted living facility. 11.Altered mental status with a history of dementia, status post psych     eval this  admission with medication adjustments.  GOALS OF CARE AT THIS TIME:  Based on sister-in-law's long relationship with [facility], she is making decisions for this patient based on what she thinks is the patient's best interest and what the patient would want if she were able to converse appropriately.  I have confirmed with Debbie [name] the things we talked about and she verbalizes agreement with the decisions made at this time. 1. Confirm do not resuscitate, do not intubate no heroic measures. 2. Optimize the current medical care including but not limited to     antibiotics, IV fluids etc.  Then, the patient will be discharged     out. Her disposition will be to a skilled nursing facility at this     time with palliative care services following her.  She is not     appropriate for assisted living facility any further. 3. Nutrition as tolerated.  No PEG, Panda, Dobbhoff, or TPN therapy.     The patient would like to continue thin liquids and sister-in-law     is in agreement with this and diet without restrictions, most     likely pureed diet will be the most appropriate for this patient. 4. The patient would  like to return to the hospital as needed in the     future for treatments. 5. She is receptive to blood and blood products as indicated. 6. Today we will initiate bowel protocol. 7. The patient's family is very understanding and receptive to the     information we have talked about today.  Sister-in-law states that     they really want to focus on comfort and quality of life as the     patient's baseline continues to decline.  We had a detailed     discussion regarding dysphasia, aspiration, the risks and benefits     of tube feedings, deconditioning, recurrent falls, her safety     concerns, the delirium, the high risk for infections and recurrent     infections, and what would occur in the event that the patient were     coded.  The family wants medications optimize for  pain and anxiety.     Family clearly wants care focused on the patient's best interest     and as condition declines they are receptive to health care     providers making recommendations in Debbie [name]'s best interest.     We will continue to follow along for symptom management.  Thank you for the opportunity to assist with this patient's care.  This is Dorian Pod, I can be reached at [phone].     Dorian Pod, ACNP   ______________________________ Mauro Kaufmann, MD    MB/MEDQ  D:  11/22/2010  T:  11/22/2010  Job:  [id number]  cc:   Hillery Aldo, M.D.  Florentina Jenny, MD Fax: [phone]  Electronically Signed by Dorian Pod ACNP on 11/29/2010 05:11:18 PM Electronically Signed by Mauro Kaufmann  on 12/30/2010 04:24:58 PM

## 2010-12-31 LAB — COMPREHENSIVE METABOLIC PANEL
ALT: 15
ALT: 14
ALT: 14
Alkaline Phosphatase: 133 — ABNORMAL HIGH
Alkaline Phosphatase: 116
BUN: 21
CO2: 29
CO2: 26
CO2: 22
Calcium: 9.2
Chloride: 104
Chloride: 103
Creatinine, Ser: 1.74 — ABNORMAL HIGH
GFR calc non Af Amer: 30 — ABNORMAL LOW
GFR calc non Af Amer: 27 — ABNORMAL LOW
Glucose, Bld: 145 — ABNORMAL HIGH
Glucose, Bld: 96
Glucose, Bld: 118 — ABNORMAL HIGH
Potassium: 4.4
Potassium: 4.1
Sodium: 138
Sodium: 137
Total Bilirubin: 0.9
Total Bilirubin: 0.7
Total Protein: 6.7

## 2010-12-31 LAB — CBC
HCT: 37.7
Hemoglobin: 13.5
Hemoglobin: 13.9
MCHC: 33.4
MCV: 94.9
Platelets: 307
RBC: 4.2
RBC: 4.4
RDW: 13.5
WBC: 5.8
WBC: 8.3
WBC: 7.6

## 2010-12-31 LAB — URINE MICROSCOPIC-ADD ON

## 2010-12-31 LAB — POCT CARDIAC MARKERS: Operator id: 133351

## 2010-12-31 LAB — URINE CULTURE: Colony Count: 50000

## 2010-12-31 LAB — DIFFERENTIAL
Basophils Absolute: 0
Basophils Absolute: 0
Basophils Relative: 0
Basophils Relative: 0
Eosinophils Absolute: 0.2
Eosinophils Absolute: 0.1
Eosinophils Absolute: 0.3
Eosinophils Relative: 1
Lymphs Abs: 0.6 — ABNORMAL LOW
Monocytes Absolute: 0.5
Monocytes Relative: 6
Neutro Abs: 6.3
Neutrophils Relative %: 79 — ABNORMAL HIGH
Neutrophils Relative %: 86 — ABNORMAL HIGH
Neutrophils Relative %: 83 — ABNORMAL HIGH

## 2010-12-31 LAB — URINALYSIS, ROUTINE W REFLEX MICROSCOPIC
Glucose, UA: NEGATIVE
Glucose, UA: NEGATIVE
Ketones, ur: NEGATIVE
Protein, ur: NEGATIVE
Specific Gravity, Urine: 1.021
pH: 6

## 2010-12-31 LAB — LIPASE, BLOOD: Lipase: 57

## 2015-01-08 ENCOUNTER — Encounter: Payer: Self-pay | Admitting: Neurology

## 2015-01-08 ENCOUNTER — Ambulatory Visit (INDEPENDENT_AMBULATORY_CARE_PROVIDER_SITE_OTHER): Payer: Medicare Other | Admitting: Neurology

## 2015-01-08 VITALS — BP 137/80 | HR 55

## 2015-01-08 DIAGNOSIS — R269 Unspecified abnormalities of gait and mobility: Secondary | ICD-10-CM | POA: Insufficient documentation

## 2015-01-08 DIAGNOSIS — F02818 Dementia in other diseases classified elsewhere, unspecified severity, with other behavioral disturbance: Secondary | ICD-10-CM

## 2015-01-08 DIAGNOSIS — F0280 Dementia in other diseases classified elsewhere without behavioral disturbance: Secondary | ICD-10-CM

## 2015-01-08 DIAGNOSIS — G309 Alzheimer's disease, unspecified: Secondary | ICD-10-CM

## 2015-01-08 DIAGNOSIS — G301 Alzheimer's disease with late onset: Secondary | ICD-10-CM | POA: Diagnosis not present

## 2015-01-08 DIAGNOSIS — F0281 Dementia in other diseases classified elsewhere with behavioral disturbance: Secondary | ICD-10-CM

## 2015-01-08 HISTORY — DX: Dementia in other diseases classified elsewhere, unspecified severity, without behavioral disturbance, psychotic disturbance, mood disturbance, and anxiety: F02.80

## 2015-01-08 NOTE — Progress Notes (Signed)
Reason for visit: Dementia  Referring physician: Dr. Alberteen [name] is a 81 y.o. female  History of present illness:  Debbie [name] is a 80 year old white female with a history of progressive memory disorder. The patient currently is residing in an extended care facility, [facility]. The patient has apparently had some issues with agitation in the evening hours. Review of the nurse's notes over the last 2 months shows only one definite documentation of agitation. The person with the patient today is not sure of how often the episodes of agitation occurs. The patient may scream or strike out at someone. The patient is on low-dose Risperdal taking 0.25 mg at night, she is on Namenda taking 5 mg twice daily. The patient has clonazepam dosing twice a day, and she is on Mysoline. The patient is able to feed herself, she is minimally verbal. She is not ambulatory, and she requires some assistance with standing. The patient generally behaves fairly well during the daytime. She is sent to this office for an evaluation.  Past Medical History  Diagnosis Date  . Dementia   . CAD (coronary artery disease)   . Hypertension   . Chronic kidney disease   . Chronic pain   . Failure to thrive in adult   . Depression   . Osteoarthritis   . Psychosis   . Atrial flutter (HCC)   . Dyslipidemia   . GERD (gastroesophageal reflux disease)   . Anemia   . Hypothyroid   . Insomnia   . Alzheimer's disease 01/08/2015    History reviewed. No pertinent past surgical history.  History reviewed. No pertinent family history.  Social history:  reports that she has never smoked. She has never used smokeless tobacco. She reports that she does not drink alcohol or use illicit drugs.  Medications:  Prior to Admission medications   Medication Sig Start Date End Date Taking? Authorizing Provider  clonazePAM (KLONOPIN) 0.25 MG disintegrating tablet Take 0.25 mg by mouth 2 (two) times  daily as needed for seizure.   Yes Historical Provider, MD  diphenhydrAMINE (BENADRYL) 25 MG tablet Take 25 mg by mouth every 6 (six) hours as needed.   Yes Historical Provider, MD  hydrocortisone butyrate (LUCOID) 0.1 % CREA cream Apply 1 application topically 2 (two) times daily.   Yes Historical Provider, MD  levothyroxine (SYNTHROID, LEVOTHROID) 112 MCG tablet Take 112 mcg by mouth daily before breakfast.   Yes Historical Provider, MD  memantine (NAMENDA) 5 MG tablet Take 5 mg by mouth 2 (two) times daily.   Yes Historical Provider, MD  mirtazapine (REMERON) 15 MG tablet Take 15 mg by mouth at bedtime.   Yes Historical Provider, MD  acetaminophen (TYLENOL) 650 MG CR tablet Take 650 mg by mouth every 6 (six) hours.    Historical Provider, MD  aspirin 81 MG tablet Take 81 mg by mouth daily.    Historical Provider, MD  cholecalciferol (VITAMIN D) 1000 UNITS tablet Take 1,000 Units by mouth daily.    Historical Provider, MD  clonazePAM (KLONOPIN) 0.5 MG tablet Take 0.5 mg by mouth 2 (two) times daily as needed for anxiety.    Historical Provider, MD  Cyanocobalamin (VITAMIN B-12 PO) Take 1 tablet by mouth daily.    Historical Provider, MD  lansoprazole (PREVACID) 30 MG capsule Take 30 mg by mouth daily at 12 noon.    Historical Provider, MD  LORazepam (ATIVAN) 0.5 MG tablet Take 0.5 mg by mouth at bedtime.  Historical Provider, MD  Melatonin 1 MG TABS Take 1 tablet by mouth at bedtime.    Historical Provider, MD  ondansetron (ZOFRAN) 4 MG tablet Take 4 mg by mouth every 8 (eight) hours as needed for nausea or vomiting.    Historical Provider, MD  polyethylene glycol (MIRALAX / GLYCOLAX) packet Take 17 g by mouth daily.    Historical Provider, MD  pravastatin (PRAVACHOL) 20 MG tablet Take 20 mg by mouth daily.    Historical Provider, MD  primidone (MYSOLINE) 50 MG tablet Take 50 mg by mouth at bedtime.    Historical Provider, MD  risperiDONE (RISPERDAL) 0.25 MG tablet Take 0.25 mg by mouth 2 (two)  times daily.    Historical Provider, MD  traMADol (ULTRAM) 50 MG tablet Take by mouth 2 (two) times daily.    Historical Provider, MD      Allergies  Allergen Reactions  . Ambien [Zolpidem Tartrate]     ROS:  Out of a complete 14 system review of symptoms, the patient complains only of the following symptoms, and all other reviewed systems are negative.  Memory disorder Agitation Gait disorder  Blood pressure 137/80, pulse 55.  Physical Exam  General: The patient is alert and cooperative at the time of the examination.  Eyes: Pupils are equal, round, and reactive to light. Discs are flat bilaterally.  Neck: The neck is supple, no carotid bruits are noted.  Respiratory: The respiratory examination is clear.  Cardiovascular: The cardiovascular examination reveals a regular rate and rhythm, no obvious murmurs or rubs are noted.  Skin: Extremities are without significant edema.  Neurologic Exam  Mental status: The patient is alert and she is cooperative at the examination. She is able to state her name, she is not oriented to place or date.  Cranial nerves: Facial symmetry is present. There is good sensation of the face to pinprick and soft touch bilaterally. The strength of the facial muscles and the muscles to head turning and shoulder shrug are normal bilaterally. Speech is dysarthric, difficult to understand. Extraocular movements are full. Visual fields are full to threat. The tongue is midline, and the patient has symmetric elevation of the soft palate. No obvious hearing deficits are noted.  Motor: The motor testing reveals 5 over 5 strength of all 4 extremities. Good symmetric motor tone is noted throughout.  Sensory: Sensory testing is intact to pinprick and soft touch bilaterally.  Coordination: Cerebellar testing reveals good finger-nose-finger with the right upper extremity, the patient has apraxia with the use of the left arm and both legs.  Gait and station:  The patient could not effectively stand. With assistance, the patient was partially elevated, has a very definite tendency to lean backwards, he could not bear weight independently.  Reflexes: Deep tendon reflexes are symmetric and normal bilaterally. Toes are downgoing bilaterally.   CT head September 04, 2010:  IMPRESSION:  1. Significant central cortical atrophy and small vessel disease. 2. No evidence for acute intracranial abnormality.  * CT scan images were reviewed online. I agree with the written report.   Assessment/Plan:  1. Progressive dementia  2. Gait disorder  The patient has had some episodes of agitation that appears to occur at times. The frequency of this is not clear from the nursing notes. The patient is on very low-dose Risperdal, she does have tremors involving both upper extremities. Would consider a switch to Abilify, not sure that Nuedexta is likely to be of any benefit. The patient could potentially go  up on the mirtazapine as well for agitation if needed. The patient will follow-up through this office on an as-needed basis.  Marlan Palau MD 01/08/2015 8:07 PM  [facility] [address] [place], [place] [address]  Phone [phone] Fax [phone]

## 2015-01-08 NOTE — Patient Instructions (Signed)
Alzheimer Disease °Alzheimer disease is a mental disorder. It causes memory loss and loss of other mental functions, such as learning, thinking, problem solving, communicating, and completing tasks. The mental losses interfere with the ability to perform daily activities at work, at home, or in social situations. °Alzheimer disease usually starts in a person's late 60s or early 70s but can start earlier in life (familial form). The mental changes caused by this disease are permanent and worsen over time. As the illness progresses, the ability to do even the simplest things is lost. Survival with Alzheimer disease ranges from several years to as long as 20 years. °CAUSES °Alzheimer disease is caused by abnormally high levels of a protein (beta-amyloid) in the brain. This protein forms very small deposits within and around the brain's nerve cells. These deposits prevent the nerve cells from working properly. Experts are not certain what causes the beta-amyloid deposits in this disease. °RISK FACTORS °The following major risk factors have been identified: °· Increasing age. °· Certain genetic variations, such as Down syndrome (trisomy 21). °SYMPTOMS °In the early stages of Alzheimer disease, you are still able to perform daily activities but need greater effort, more time, or memory aids. Early symptoms include: °· Mild memory loss of recent events, names, or phone numbers. °· Loss of objects. °· Minor loss of vocabulary. °· Difficulty with complex tasks, such as paying bills or driving in unfamiliar locations. °Other mental functions deteriorate as the disease worsens. These changes slowly go from mild to severe. Symptoms at this stage include: °· Difficulty remembering. You may not be able to recall personal information such as your address and telephone number. You may become confused about the date, the season of the year, or your location. °· Difficulty maintaining attention. You may forget what you wanted to say  during conversations and repeat what you have already said. °· Difficulty learning new information or tasks. You may not remember what you read or the name of a new friend you met. °· Difficulty counting or doing math. You may have difficulty with complex math problems. You may make mistakes in paying bills or managing your checkbook. °· Poor reasoning and judgment. You may make poor decisions or not dress right for the weather. °· Difficulty communicating. You may have regular difficulty remembering words, naming objects, expressing yourself clearly, or writing sentences that make sense. °· Difficulty performing familiar daily activities. You may get lost driving in familiar locations or need help eating, bathing, dressing, grooming, or using the toilet. You may have difficulty maintaining bladder or bowel control. °· Difficulty recognizing familiar faces. You may confuse family members or close friends with one another. You may not recognize a close relative or may mistake strangers for family. °Alzheimer disease also may cause changes in personality and behavior. These changes include:  °· Loss of interest or motivation. °· Social withdrawal. °· Anxiety. °· Difficulty sleeping. °· Uncharacteristic anger or combativeness. °· A false belief that someone is trying to harm you (paranoia). °· Seeing things that are not real (hallucinations). °· Agitation. °Confusion and disruptive behavior are often worse at night and may be triggered by changes in the environment or acute medical issues. °DIAGNOSIS  °Alzheimer disease is diagnosed through an assessment by your health care provider. During this assessment, your health care provider will do the following: °· Ask you and your family, friends, or caregivers questions about your symptoms, their frequency, their duration and progression, and the effect they are having on your life. °·   Ask questions about your personal and family medical history and use of alcohol or drugs,  including prescription medicine.  Perform a physical exam and order blood tests and brain imaging exams. Your health care provider may refer you to a specialist for detailed evaluation of your mental functions (neuropsychological testing).  Many different brain disorders, medical conditions, and certain substances can cause symptoms that resemble Alzheimer disease symptoms. These must be ruled out before this disease can be diagnosed. If Alzheimer disease is diagnosed, it will be considered either "possible" or "probable" Alzheimer disease. "Possible" Alzheimer disease means that your symptoms are typical of the disease and no other disorder is causing them. "Probable" Alzheimer disease means that you also have a family history of the disease or genetic test results that support the diagnosis. Certain tests, mostly used in research studies, are highly specific for Alzheimer disease.  TREATMENT  There is currently no cure for this disease. The goals of treatment are to:  Slow down the progression of the disease.  Preserve mental function as long as possible.  Manage behavioral symptoms.  Make life easier for the person with Alzheimer disease and his or her caregivers. The following treatment options are available:  Medicine. Certain medicines may help slow memory loss by changing the level of certain chemicals in the brain. Medicine may also help with behavioral symptoms.  Talk therapy. Talk therapy provides education, support, and memory aids for people with this disease. It is most effective in the early stages of the illness.  Caregiving. Caregivers may be family members, friends, or trained medical professionals. They help the person with Alzheimer disease with daily life activities. Caregiving may take place at home or at a nursing facility.  Family support groups. These provide education, emotional support, and information about community resources to family members who are taking care of  the person with this disease.   This information is not intended to replace advice given to you by your health care provider. Make sure you discuss any questions you have with your health care provider.   Document Released: 11/20/2003 Document Revised: 03/31/2014 Document Reviewed: 07/16/2012 [employer] Interactive Patient Education [employer].

## 2015-10-23 DEATH — deceased
